# Patient Record
Sex: Female | Born: 1955 | Race: White | Hispanic: No | Marital: Married | Smoking: Never smoker
Health system: Southern US, Community
[De-identification: ages and names within clinical notes are randomized; demographics above are authoritative.]

## PROBLEM LIST (undated history)

## (undated) DIAGNOSIS — IMO0002 Reserved for concepts with insufficient information to code with codable children: Secondary | ICD-10-CM

## (undated) DIAGNOSIS — E079 Disorder of thyroid, unspecified: Secondary | ICD-10-CM

## (undated) DIAGNOSIS — I10 Essential (primary) hypertension: Secondary | ICD-10-CM

## (undated) HISTORY — DX: Disorder of thyroid, unspecified: E07.9

## (undated) HISTORY — DX: Essential (primary) hypertension: I10

## (undated) HISTORY — PX: COLONOSCOPY: SHX174

## (undated) HISTORY — PX: APPENDECTOMY: SHX54

## (undated) HISTORY — DX: Reserved for concepts with insufficient information to code with codable children: IMO0002

---

## 2002-03-28 ENCOUNTER — Emergency Department (HOSPITAL_COMMUNITY): Admission: EM | Admit: 2002-03-28 | Discharge: 2002-03-29 | Payer: Self-pay | Admitting: Emergency Medicine

## 2002-03-28 ENCOUNTER — Encounter: Payer: Self-pay | Admitting: Emergency Medicine

## 2002-04-09 ENCOUNTER — Ambulatory Visit (HOSPITAL_COMMUNITY): Admission: RE | Admit: 2002-04-09 | Discharge: 2002-04-09 | Payer: Self-pay | Admitting: Family Medicine

## 2002-04-09 ENCOUNTER — Encounter: Payer: Self-pay | Admitting: Family Medicine

## 2003-07-11 ENCOUNTER — Encounter: Admission: RE | Admit: 2003-07-11 | Discharge: 2003-08-28 | Payer: Self-pay | Admitting: Orthopedic Surgery

## 2007-09-11 ENCOUNTER — Encounter: Admission: RE | Admit: 2007-09-11 | Discharge: 2007-09-11 | Payer: Self-pay | Admitting: Orthopedic Surgery

## 2012-08-06 ENCOUNTER — Other Ambulatory Visit: Payer: Self-pay | Admitting: Nurse Practitioner

## 2012-08-09 NOTE — Telephone Encounter (Signed)
LAST LABS 12/13 SUPPOSE TO RETURN IN 6 WEEKS. NO LABS IN CHART.

## 2012-09-10 ENCOUNTER — Ambulatory Visit (INDEPENDENT_AMBULATORY_CARE_PROVIDER_SITE_OTHER): Payer: BC Managed Care – PPO | Admitting: Nurse Practitioner

## 2012-09-10 ENCOUNTER — Encounter: Payer: Self-pay | Admitting: Nurse Practitioner

## 2012-09-10 VITALS — BP 110/64 | HR 66 | Temp 97.9°F | Ht 61.4 in | Wt 137.0 lb

## 2012-09-10 DIAGNOSIS — I10 Essential (primary) hypertension: Secondary | ICD-10-CM

## 2012-09-10 DIAGNOSIS — E039 Hypothyroidism, unspecified: Secondary | ICD-10-CM

## 2012-09-10 MED ORDER — LEVOTHYROXINE SODIUM 88 MCG PO TABS: 88.0000 ug | ORAL_TABLET | Freq: Every day | ORAL | Status: DC

## 2012-09-10 MED ORDER — LISINOPRIL-HYDROCHLOROTHIAZIDE 10-12.5 MG PO TABS: 1.0000 | ORAL_TABLET | Freq: Every day | ORAL | Status: DC

## 2012-09-10 NOTE — Patient Instructions (Signed)

## 2012-09-10 NOTE — Progress Notes (Signed)
  Subjective:    Patient ID: Alison Simmons, female    DOB: 04/04/1956, 57 y.o.   MRN: [id number]  Hypertension This is a chronic problem. The current episode started more than 1 year ago. The problem is unchanged. The problem is controlled. Pertinent negatives include no anxiety, blurred vision, chest pain, headaches, neck pain, palpitations, peripheral edema or shortness of breath. There are no associated agents to hypertension. Risk factors for coronary artery disease include dyslipidemia and post-menopausal state. Past treatments include ACE inhibitors and diuretics. The current treatment provides significant improvement. Hypertensive end-organ damage includes a thyroid problem.  Thyroid Problem Presents for follow-up visit. Symptoms include fatigue. Patient reports no anxiety, diarrhea, dry skin, hair loss, hoarse voice or palpitations. The symptoms have been stable.       Review of Systems  Constitutional: Positive for fatigue.  HENT: Negative for hoarse voice and neck pain.   Eyes: Negative for blurred vision.  Respiratory: Negative for shortness of breath.   Cardiovascular: Negative for chest pain and palpitations.  Gastrointestinal: Negative for diarrhea.  Neurological: Negative for headaches.  All other systems reviewed and are negative.       Objective:   Physical Exam  Constitutional: She is oriented to person, place, and time. She appears well-developed and well-nourished.  HENT:  Nose: Nose normal.  Mouth/Throat: Oropharynx is clear and moist.  Eyes: EOM are normal.  Neck: Trachea normal, normal range of motion and full passive range of motion without pain. Neck supple. No JVD present. Carotid bruit is not present. No thyromegaly present.  Cardiovascular: Normal rate, regular rhythm, normal heart sounds and intact distal pulses.  Exam reveals no gallop and no friction rub.   No murmur heard. Pulmonary/Chest: Effort normal and breath sounds normal.  Abdominal: Soft.  Bowel sounds are normal. She exhibits no distension and no mass. There is no tenderness.  Musculoskeletal: Normal range of motion.  Lymphadenopathy:    She has no cervical adenopathy.  Neurological: She is alert and oriented to person, place, and time. She has normal reflexes.  Skin: Skin is warm and dry.  Psychiatric: She has a normal mood and affect. Her behavior is normal. Judgment and thought content normal.  BP 110/64  Pulse 66  Temp(Src) 97.9 F (36.6 C) (Oral)  Ht 5' 1.4" (1.56 m)  Wt 137 lb (62.143 kg)  BMI 25.54 kg/m2         Assessment & Plan:  1. Hypertension LOW NA+ diet  - COMPLETE METABOLIC PANEL WITH GFR - lisinopril-hydrochlorothiazide (PRINZIDE,ZESTORETIC) 10-12.5 MG per tablet; Take 1 tablet by mouth daily.  Dispense: 90 tablet; Refill: 1  2. Hypothyroidism Rest - Thyroid Panel With TSH - levothyroxine (SYNTHROID, LEVOTHROID) 88 MCG tablet; Take 1 tablet (88 mcg total) by mouth daily before breakfast.  Dispense: 90 tablet; Refill: 1   Mary-Margaret Daphine Deutscher, FNP

## 2012-09-11 LAB — COMPLETE METABOLIC PANEL WITH GFR
ALT: 24 U/L (ref 0–35)
AST: 19 U/L (ref 0–37)
Albumin: 4.2 g/dL (ref 3.5–5.2)
Alkaline Phosphatase: 87 U/L (ref 39–117)
BUN: 20 mg/dL (ref 6–23)
CO2: 29 mEq/L (ref 19–32)
Calcium: 9.9 mg/dL (ref 8.4–10.5)
Chloride: 101 mEq/L (ref 96–112)
Creat: 0.97 mg/dL (ref 0.50–1.10)
GFR, Est African American: 77 mL/min
GFR, Est Non African American: 66 mL/min
Glucose, Bld: 93 mg/dL (ref 70–99)
Potassium: 4.8 mEq/L (ref 3.5–5.3)
Sodium: 137 mEq/L (ref 135–145)
Total Bilirubin: 0.6 mg/dL (ref 0.3–1.2)
Total Protein: 6.7 g/dL (ref 6.0–8.3)

## 2012-09-11 LAB — THYROID PANEL WITH TSH
Free Thyroxine Index: 3.5 (ref 1.0–3.9)
T3 Uptake: 35.4 % (ref 22.5–37.0)
T4, Total: 10 ug/dL (ref 5.0–12.5)
TSH: 0.658 u[IU]/mL (ref 0.350–4.500)

## 2013-02-17 ENCOUNTER — Other Ambulatory Visit: Payer: Self-pay

## 2013-03-02 ENCOUNTER — Encounter: Payer: Self-pay | Admitting: Nurse Practitioner

## 2013-03-02 ENCOUNTER — Ambulatory Visit (INDEPENDENT_AMBULATORY_CARE_PROVIDER_SITE_OTHER): Payer: BC Managed Care – PPO | Admitting: Nurse Practitioner

## 2013-03-02 VITALS — BP 137/79 | HR 62 | Temp 97.6°F | Ht 61.0 in | Wt 145.0 lb

## 2013-03-02 DIAGNOSIS — E039 Hypothyroidism, unspecified: Secondary | ICD-10-CM

## 2013-03-02 DIAGNOSIS — I10 Essential (primary) hypertension: Secondary | ICD-10-CM

## 2013-03-02 MED ORDER — HYDROCODONE-ACETAMINOPHEN 5-325 MG PO TABS: 1.0000 | ORAL_TABLET | Freq: Four times a day (QID) | ORAL | Status: DC | PRN

## 2013-03-02 MED ORDER — LISINOPRIL-HYDROCHLOROTHIAZIDE 10-12.5 MG PO TABS: 1.0000 | ORAL_TABLET | Freq: Every day | ORAL | Status: DC

## 2013-03-02 NOTE — Progress Notes (Signed)
  Subjective:    Patient ID: Alison Simmons, female    DOB: 1956-03-20, 57 y.o.   MRN: [id number]  Hypertension This is a chronic problem. The current episode started more than 1 year ago. The problem is unchanged. The problem is controlled. Pertinent negatives include no anxiety, blurred vision, chest pain, headaches, neck pain, palpitations, peripheral edema or shortness of breath. There are no associated agents to hypertension. Risk factors for coronary artery disease include dyslipidemia and post-menopausal state. Past treatments include ACE inhibitors and diuretics. The current treatment provides significant improvement. Hypertensive end-organ damage includes a thyroid problem.  Thyroid Problem Presents for follow-up visit. Symptoms include fatigue. Patient reports no anxiety, diarrhea, dry skin, hair loss, hoarse voice or palpitations. The symptoms have been stable.       Review of Systems  Constitutional: Positive for fatigue.  HENT: Negative for hoarse voice.   Eyes: Negative for blurred vision.  Respiratory: Negative for shortness of breath.   Cardiovascular: Negative for chest pain and palpitations.  Gastrointestinal: Negative for diarrhea.  Musculoskeletal: Negative for neck pain.  Neurological: Negative for headaches.  All other systems reviewed and are negative.       Objective:   Physical Exam  Constitutional: She is oriented to person, place, and time. She appears well-developed and well-nourished.  HENT:  Nose: Nose normal.  Mouth/Throat: Oropharynx is clear and moist.  Eyes: EOM are normal.  Neck: Trachea normal, normal range of motion and full passive range of motion without pain. Neck supple. No JVD present. Carotid bruit is not present. No thyromegaly present.  Cardiovascular: Normal rate, regular rhythm, normal heart sounds and intact distal pulses.  Exam reveals no gallop and no friction rub.   No murmur heard. Pulmonary/Chest: Effort normal and breath sounds  normal.  Abdominal: Soft. Bowel sounds are normal. She exhibits no distension and no mass. There is no tenderness.  Musculoskeletal: Normal range of motion.  Lymphadenopathy:    She has no cervical adenopathy.  Neurological: She is alert and oriented to person, place, and time. She has normal reflexes.  Skin: Skin is warm and dry.  Psychiatric: She has a normal mood and affect. Her behavior is normal. Judgment and thought content normal.    BP 137/79  Pulse 62  Temp(Src) 97.6 F (36.4 C) (Oral)  Ht 5\' 1"  (1.549 m)  Wt 145 lb (65.772 kg)  BMI 27.41 kg/m2        Assessment & Plan:  1. Hypertension LOW NA+ diet  - COMPLETE METABOLIC PANEL WITH GFR - lisinopril-hydrochlorothiazide (PRINZIDE,ZESTORETIC) 10-12.5 MG per tablet; Take 1 tablet by mouth daily.  Dispense: 90 tablet; Refill: 1  2. Hypothyroidism Rest - Thyroid Panel With TSH - levothyroxine (SYNTHROID, LEVOTHROID) 88 MCG tablet; Take 1 tablet (88 mcg total) by mouth daily before breakfast.  Dispense: 90 tablet; Refill: 1   Mary-Margaret Daphine Deutscher, FNP

## 2013-03-02 NOTE — Patient Instructions (Signed)
Stress Management Stress is a state of physical or mental tension that often results from changes in your life or normal routine. Some common causes of stress are:  Death of a loved one.  Injuries or severe illnesses.  Getting fired or changing jobs.  Moving into a new home. Other causes may be:  Sexual problems.  Business or financial losses.  Taking on a large debt.  Regular conflict with someone at home or at work.  Constant tiredness from lack of sleep. It is not just bad things that are stressful. It may be stressful to:  Win the lottery.  Get married.  Buy a new car. The amount of stress that can be easily tolerated varies from person to person. Changes generally cause stress, regardless of the types of change. Too much stress can affect your health. It may lead to physical or emotional problems. Too little stress (boredom) may also become stressful. SUGGESTIONS TO REDUCE STRESS:  Talk things over with your family and friends. It often is helpful to share your concerns and worries. If you feel your problem is serious, you may want to get help from a professional counselor.  Consider your problems one at a time instead of lumping them all together. Trying to take care of everything at once may seem impossible. List all the things you need to do and then start with the most important one. Set a goal to accomplish 2 or 3 things each day. If you expect to do too many in a single day you will naturally fail, causing you to feel even more stressed.  Do not use alcohol or drugs to relieve stress. Although you may feel better for a short time, they do not remove the problems that caused the stress. They can also be habit forming.  Exercise regularly - at least 3 times per week. Physical exercise can help to relieve that "uptight" feeling and will relax you.  The shortest distance between despair and hope is often a good night's sleep.  Go to bed and get up on time allowing  yourself time for appointments without being rushed.  Take a short "time-out" period from any stressful situation that occurs during the day. Close your eyes and take some deep breaths. Starting with the muscles in your face, tense them, hold it for a few seconds, then relax. Repeat this with the muscles in your neck, shoulders, hand, stomach, back and legs.  Take good care of yourself. Eat a balanced diet and get plenty of rest.  Schedule time for having fun. Take a break from your daily routine to relax. HOME CARE INSTRUCTIONS   Call if you feel overwhelmed by your problems and feel you can no longer manage them on your own.  Return immediately if you feel like hurting yourself or someone else. Document Released: 09/24/2000 Document Revised: 06/23/2011 Document Reviewed: 11/23/2012 [employer] Patient Information 2014 [employer].  

## 2013-03-04 ENCOUNTER — Other Ambulatory Visit: Payer: Self-pay | Admitting: Nurse Practitioner

## 2013-03-04 LAB — CMP14+EGFR
ALT: 14 IU/L (ref 0–32)
AST: 16 IU/L (ref 0–40)
Albumin/Globulin Ratio: 2 (ref 1.1–2.5)
Albumin: 4.8 g/dL (ref 3.5–5.5)
BUN/Creatinine Ratio: 17 (ref 9–23)
BUN: 12 mg/dL (ref 6–24)
CO2: 25 mmol/L (ref 18–29)
Calcium: 10 mg/dL (ref 8.7–10.2)
Creatinine, Ser: 0.71 mg/dL (ref 0.57–1.00)
GFR calc non Af Amer: 97 mL/min/{1.73_m2} (ref >59)
Globulin, Total: 2.4 g/dL (ref 1.5–4.5)
Sodium: 142 mmol/L (ref 134–144)
Total Bilirubin: 1.1 mg/dL (ref 0.0–1.2)

## 2013-03-04 LAB — NMR, LIPOPROFILE
Cholesterol: 209 mg/dL — ABNORMAL HIGH (ref <200)
LDL Particle Number: 1919 nmol/L — ABNORMAL HIGH (ref <1000)
LDL Size: 20.5 nm — ABNORMAL LOW (ref >20.5)
LDLC SERPL CALC-MCNC: 116 mg/dL — ABNORMAL HIGH (ref <100)
LP-IR Score: <25 (ref <=45)
Triglycerides by NMR: 156 mg/dL — ABNORMAL HIGH (ref <150)

## 2013-03-04 LAB — THYROID PANEL WITH TSH
T3 Uptake Ratio: 28 % (ref 24–39)
T4, Total: 8.3 ug/dL (ref 4.5–12.0)

## 2013-03-04 MED ORDER — LEVOTHYROXINE SODIUM 100 MCG PO TABS: 100.0000 ug | ORAL_TABLET | Freq: Every day | ORAL | Status: DC

## 2013-08-03 ENCOUNTER — Ambulatory Visit (INDEPENDENT_AMBULATORY_CARE_PROVIDER_SITE_OTHER): Payer: BC Managed Care – PPO | Admitting: Nurse Practitioner

## 2013-08-03 ENCOUNTER — Encounter: Payer: Self-pay | Admitting: Nurse Practitioner

## 2013-08-03 VITALS — BP 112/68 | Temp 97.5°F | Ht 61.0 in | Wt 138.0 lb

## 2013-08-03 DIAGNOSIS — E039 Hypothyroidism, unspecified: Secondary | ICD-10-CM

## 2013-08-03 DIAGNOSIS — Z13820 Encounter for screening for osteoporosis: Secondary | ICD-10-CM

## 2013-08-03 DIAGNOSIS — L989 Disorder of the skin and subcutaneous tissue, unspecified: Secondary | ICD-10-CM

## 2013-08-03 DIAGNOSIS — R5383 Other fatigue: Secondary | ICD-10-CM

## 2013-08-03 DIAGNOSIS — I10 Essential (primary) hypertension: Secondary | ICD-10-CM

## 2013-08-03 DIAGNOSIS — R5381 Other malaise: Secondary | ICD-10-CM

## 2013-08-03 DIAGNOSIS — R238 Other skin changes: Secondary | ICD-10-CM

## 2013-08-03 MED ORDER — LISINOPRIL-HYDROCHLOROTHIAZIDE 10-12.5 MG PO TABS: 1.0000 | ORAL_TABLET | Freq: Every day | ORAL | Status: DC

## 2013-08-03 NOTE — Progress Notes (Signed)
Subjective:    Patient ID: Alison Simmons, female    DOB: 08/11/1955, 58 y.o.   MRN: [id number]  Patient here today for follow up of chronic medical problems. Stays exhausted all the time. Says that she sleeps ok at night.  Hypertension This is a chronic problem. The current episode started more than 1 year ago. The problem is unchanged. The problem is controlled. Pertinent negatives include no anxiety, blurred vision, chest pain, headaches, neck pain, palpitations, peripheral edema or shortness of breath. There are no associated agents to hypertension. Risk factors for coronary artery disease include dyslipidemia and post-menopausal state. Past treatments include ACE inhibitors and diuretics. The current treatment provides significant improvement. Hypertensive end-organ damage includes a thyroid problem.  Thyroid Problem Presents for follow-up visit. Symptoms include fatigue. Patient reports no anxiety, diarrhea, dry skin, hair loss, hoarse voice or palpitations. The symptoms have been stable (Her last TSH was >9 and we increased her levothyroxin to 145mg- She says she feels terrible.).    * scalp is very dry with sores that come up around scalp line- wants to see dermatology   Review of Systems  Constitutional: Positive for fatigue.  HENT: Negative for hoarse voice.   Eyes: Negative for blurred vision.  Respiratory: Negative for shortness of breath.   Cardiovascular: Negative for chest pain and palpitations.  Gastrointestinal: Negative for diarrhea.  Musculoskeletal: Negative for neck pain.  Neurological: Negative for headaches.  All other systems reviewed and are negative.      Objective:   Physical Exam  Constitutional: She is oriented to person, place, and time. She appears well-developed and well-nourished.  HENT:  Nose: Nose normal.  Mouth/Throat: Oropharynx is clear and moist.  Eyes: EOM are normal.  Neck: Trachea normal, normal range of motion and full passive range of  motion without pain. Neck supple. No JVD present. Carotid bruit is not present. No thyromegaly present.  Cardiovascular: Normal rate, regular rhythm, normal heart sounds and intact distal pulses.  Exam reveals no gallop and no friction rub.   No murmur heard. Pulmonary/Chest: Effort normal and breath sounds normal.  Abdominal: Soft. Bowel sounds are normal. She exhibits no distension and no mass. There is no tenderness.  Musculoskeletal: Normal range of motion.  Lymphadenopathy:    She has no cervical adenopathy.  Neurological: She is alert and oriented to person, place, and time. She has normal reflexes.  Skin: Skin is warm and dry.  No current scalp lesions  Psychiatric: She has a normal mood and affect. Her behavior is normal. Judgment and thought content normal.   BP 112/68  Temp(Src) 97.5 F (36.4 C) (Oral)  Ht _0  (1.549 m)  Wt 138 lb (62.596 kg)  BMI 26.09 kg/m2         Assessment & Plan:   1. Hypothyroidism   2. Hypertension   3. Fatigue   4. Scalp irritation    Orders Placed This Encounter  Procedures  . CMP14+EGFR  . Anemia Profile B  . Thyroid Panel With TSH  . Ambulatory referral to Dermatology    Referral Priority:  Routine    Referral Type:  Consultation    Referral Reason:  Specialty Services Required    Requested Specialty:  Dermatology    Number of Visits Requested:  1   Meds ordered this encounter  Medications  . aspirin 81 MG tablet    Sig: Take 81 mg by mouth daily.  .Marland Kitchenlisinopril-hydrochlorothiazide (PRINZIDE,ZESTORETIC) 10-12.5 MG per tablet    Sig: Take 1  tablet by mouth daily.    Dispense:  90 tablet    Refill:  1    Order Specific Question:  Supervising Provider    Answer:  Chipper Herb [[id number]]    Labs pending Health maintenance reviewed Diet and exercise encouraged Continue all meds Follow up  In 3 months   [place], FNP

## 2013-08-03 NOTE — Patient Instructions (Signed)

## 2013-08-04 ENCOUNTER — Other Ambulatory Visit: Payer: Self-pay | Admitting: Nurse Practitioner

## 2013-08-04 LAB — CMP14+EGFR
A/G RATIO: 2 (ref 1.1–2.5)
ALK PHOS: 93 IU/L (ref 39–117)
ALT: 27 IU/L (ref 0–32)
AST: 23 IU/L (ref 0–40)
Albumin: 4.8 g/dL (ref 3.5–5.5)
BILIRUBIN TOTAL: 1 mg/dL (ref 0.0–1.2)
BUN / CREAT RATIO: 19 (ref 9–23)
BUN: 13 mg/dL (ref 6–24)
CO2: 26 mmol/L (ref 18–29)
Calcium: 10.3 mg/dL — ABNORMAL HIGH (ref 8.7–10.2)
Chloride: 96 mmol/L — ABNORMAL LOW (ref 97–108)
Creatinine, Ser: 0.67 mg/dL (ref 0.57–1.00)
GFR, EST AFRICAN AMERICAN: 114 mL/min/{1.73_m2} (ref >59)
GFR, EST NON AFRICAN AMERICAN: 99 mL/min/{1.73_m2} (ref >59)
Globulin, Total: 2.4 g/dL (ref 1.5–4.5)
Glucose: 89 mg/dL (ref 65–99)
POTASSIUM: 5.2 mmol/L (ref 3.5–5.2)
SODIUM: 137 mmol/L (ref 134–144)
Total Protein: 7.2 g/dL (ref 6.0–8.5)

## 2013-08-04 LAB — ANEMIA PROFILE B
BASOS ABS: 0.1 10*3/uL (ref 0.0–0.2)
Basos: 1 %
Eos: 4 %
Eosinophils Absolute: 0.3 10*3/uL (ref 0.0–0.4)
FOLATE: 14 ng/mL (ref >3.0)
Ferritin: 110 ng/mL (ref 15–150)
HCT: 42.5 % (ref 34.0–46.6)
HEMOGLOBIN: 14.3 g/dL (ref 11.1–15.9)
IMMATURE GRANS (ABS): 0 10*3/uL (ref 0.0–0.1)
Immature Granulocytes: 0 %
Iron Saturation: 31 % (ref 15–55)
Iron: 107 ug/dL (ref 35–155)
LYMPHS: 33 %
Lymphocytes Absolute: 2.6 10*3/uL (ref 0.7–3.1)
MCH: 28.1 pg (ref 26.6–33.0)
MCHC: 33.6 g/dL (ref 31.5–35.7)
MCV: 84 fL (ref 79–97)
MONOCYTES: 7 %
Monocytes Absolute: 0.6 10*3/uL (ref 0.1–0.9)
NEUTROS ABS: 4.5 10*3/uL (ref 1.4–7.0)
NEUTROS PCT: 55 %
Platelets: 277 10*3/uL (ref 150–379)
RBC: 5.08 x10E6/uL (ref 3.77–5.28)
RDW: 13.5 % (ref 12.3–15.4)
RETIC CT PCT: 0.9 % (ref 0.6–2.6)
TIBC: 349 ug/dL (ref 250–450)
UIBC: 242 ug/dL (ref 150–375)
VITAMIN B 12: 399 pg/mL (ref 211–946)
WBC: 8 10*3/uL (ref 3.4–10.8)

## 2013-08-04 LAB — THYROID PANEL WITH TSH
FREE THYROXINE INDEX: 4.2 (ref 1.2–4.9)
T3 Uptake Ratio: 33 % (ref 24–39)
T4, Total: 12.8 ug/dL — ABNORMAL HIGH (ref 4.5–12.0)
TSH: 0.146 u[IU]/mL — AB (ref 0.450–4.500)

## 2013-08-04 MED ORDER — LEVOTHYROXINE SODIUM 88 MCG PO TABS: 88.0000 ug | ORAL_TABLET | Freq: Every day | ORAL | Status: DC

## 2013-08-31 ENCOUNTER — Other Ambulatory Visit: Payer: BC Managed Care – PPO

## 2013-09-21 ENCOUNTER — Ambulatory Visit: Payer: BC Managed Care – PPO

## 2014-01-03 ENCOUNTER — Ambulatory Visit: Payer: BC Managed Care – PPO | Attending: Orthopedic Surgery | Admitting: Physical Therapy

## 2014-01-03 DIAGNOSIS — M25579 Pain in unspecified ankle and joints of unspecified foot: Secondary | ICD-10-CM | POA: Diagnosis not present

## 2014-01-03 DIAGNOSIS — M25673 Stiffness of unspecified ankle, not elsewhere classified: Secondary | ICD-10-CM | POA: Insufficient documentation

## 2014-01-03 DIAGNOSIS — R269 Unspecified abnormalities of gait and mobility: Secondary | ICD-10-CM | POA: Insufficient documentation

## 2014-01-03 DIAGNOSIS — R5381 Other malaise: Secondary | ICD-10-CM | POA: Insufficient documentation

## 2014-01-03 DIAGNOSIS — IMO0001 Reserved for inherently not codable concepts without codable children: Secondary | ICD-10-CM | POA: Insufficient documentation

## 2014-01-03 DIAGNOSIS — M25676 Stiffness of unspecified foot, not elsewhere classified: Secondary | ICD-10-CM | POA: Insufficient documentation

## 2014-01-04 ENCOUNTER — Ambulatory Visit: Payer: BC Managed Care – PPO | Admitting: Physical Therapy

## 2014-01-04 DIAGNOSIS — IMO0001 Reserved for inherently not codable concepts without codable children: Secondary | ICD-10-CM | POA: Diagnosis not present

## 2014-01-09 ENCOUNTER — Ambulatory Visit: Payer: BC Managed Care – PPO | Admitting: Physical Therapy

## 2014-01-09 DIAGNOSIS — IMO0001 Reserved for inherently not codable concepts without codable children: Secondary | ICD-10-CM | POA: Diagnosis not present

## 2014-01-11 ENCOUNTER — Encounter: Payer: BC Managed Care – PPO | Admitting: Physical Therapy

## 2014-01-12 ENCOUNTER — Ambulatory Visit: Payer: BC Managed Care – PPO | Attending: Orthopedic Surgery | Admitting: *Deleted

## 2014-01-12 DIAGNOSIS — R5381 Other malaise: Secondary | ICD-10-CM | POA: Diagnosis not present

## 2014-01-12 DIAGNOSIS — M25571 Pain in right ankle and joints of right foot: Secondary | ICD-10-CM | POA: Diagnosis not present

## 2014-01-12 DIAGNOSIS — R269 Unspecified abnormalities of gait and mobility: Secondary | ICD-10-CM | POA: Diagnosis not present

## 2014-01-12 DIAGNOSIS — M25671 Stiffness of right ankle, not elsewhere classified: Secondary | ICD-10-CM | POA: Insufficient documentation

## 2014-01-12 DIAGNOSIS — Z5189 Encounter for other specified aftercare: Secondary | ICD-10-CM | POA: Insufficient documentation

## 2014-01-18 ENCOUNTER — Ambulatory Visit: Payer: BC Managed Care – PPO | Admitting: Physical Therapy

## 2014-01-18 DIAGNOSIS — Z5189 Encounter for other specified aftercare: Secondary | ICD-10-CM | POA: Diagnosis not present

## 2014-01-29 ENCOUNTER — Other Ambulatory Visit: Payer: Self-pay | Admitting: Nurse Practitioner

## 2014-02-22 ENCOUNTER — Other Ambulatory Visit: Payer: Self-pay | Admitting: Nurse Practitioner

## 2014-02-22 MED ORDER — LISINOPRIL-HYDROCHLOROTHIAZIDE 10-12.5 MG PO TABS: 1.0000 | ORAL_TABLET | Freq: Every day | ORAL | Status: DC

## 2014-02-22 MED ORDER — LEVOTHYROXINE SODIUM 88 MCG PO TABS: ORAL_TABLET | ORAL | Status: DC

## 2014-02-22 NOTE — Telephone Encounter (Signed)
done

## 2014-04-19 ENCOUNTER — Encounter: Payer: Self-pay | Admitting: Nurse Practitioner

## 2014-04-19 ENCOUNTER — Ambulatory Visit (INDEPENDENT_AMBULATORY_CARE_PROVIDER_SITE_OTHER): Payer: BC Managed Care – PPO | Admitting: Nurse Practitioner

## 2014-04-19 VITALS — BP 119/80 | HR 60 | Temp 96.8°F | Ht 61.0 in | Wt 140.0 lb

## 2014-04-19 DIAGNOSIS — E039 Hypothyroidism, unspecified: Secondary | ICD-10-CM

## 2014-04-19 DIAGNOSIS — I10 Essential (primary) hypertension: Secondary | ICD-10-CM

## 2014-04-19 MED ORDER — LISINOPRIL-HYDROCHLOROTHIAZIDE 10-12.5 MG PO TABS
1.0000 | ORAL_TABLET | Freq: Every day | ORAL | Status: DC
Start: 2014-04-19 — End: 2014-05-23

## 2014-04-19 MED ORDER — HYDROCODONE-ACETAMINOPHEN 5-325 MG PO TABS: 1.0000 | ORAL_TABLET | Freq: Four times a day (QID) | ORAL | Status: DC | PRN

## 2014-04-19 MED ORDER — LEVOTHYROXINE SODIUM 88 MCG PO TABS: ORAL_TABLET | ORAL | Status: DC

## 2014-04-19 NOTE — Addendum Note (Signed)
Addended by: Chevis Pretty on: 04/19/2014 03:13 PM   Modules accepted: Orders

## 2014-04-19 NOTE — Patient Instructions (Signed)

## 2014-04-19 NOTE — Progress Notes (Signed)
  Subjective:    Patient ID: Alison Simmons, female    DOB: 1955-08-19, 59 y.o.   MRN: [id number]  Hypertension This is a chronic problem. The current episode started more than 1 year ago. The problem is unchanged. The problem is controlled. Pertinent negatives include no chest pain, headaches, neck pain, palpitations or shortness of breath. Risk factors for coronary artery disease include dyslipidemia, family history and post-menopausal state. Past treatments include ACE inhibitors and diuretics. The current treatment provides moderate improvement. Compliance problems include diet and exercise.  Hypertensive end-organ damage includes a thyroid problem.  Thyroid Problem Presents for follow-up (hypothyroidism) visit. Patient reports no diarrhea or palpitations. The symptoms have been stable.       Review of Systems  Constitutional: Negative.   HENT: Negative.   Respiratory: Negative for shortness of breath.   Cardiovascular: Negative for chest pain and palpitations.  Gastrointestinal: Negative for diarrhea.  Genitourinary: Negative.   Musculoskeletal: Negative for neck pain.  Neurological: Negative.  Negative for headaches.  Psychiatric/Behavioral: Negative.   All other systems reviewed and are negative.      Objective:   Physical Exam  Constitutional: She is oriented to person, place, and time. She appears well-developed and well-nourished.  HENT:  Nose: Nose normal.  Mouth/Throat: Oropharynx is clear and moist.  Eyes: EOM are normal.  Neck: Trachea normal, normal range of motion and full passive range of motion without pain. Neck supple. No JVD present. Carotid bruit is not present. No thyromegaly present.  Cardiovascular: Normal rate, regular rhythm, normal heart sounds and intact distal pulses.  Exam reveals no gallop and no friction rub.   No murmur heard. Pulmonary/Chest: Effort normal and breath sounds normal.  Abdominal: Soft. Bowel sounds are normal. She exhibits no  distension and no mass. There is no tenderness.  Musculoskeletal: Normal range of motion.  Lymphadenopathy:    She has no cervical adenopathy.  Neurological: She is alert and oriented to person, place, and time. She has normal reflexes.  Skin: Skin is warm and dry.  Psychiatric: She has a normal mood and affect. Her behavior is normal. Judgment and thought content normal.    BP 119/80 mmHg  Pulse 60  Temp(Src) 96.8 F (36 C) (Oral)  Ht $R'5\' 1"'St$  (1.549 m)  Wt 140 lb (63.504 kg)  BMI 26.47 kg/m2        Assessment & Plan:  1. Essential hypertension Do not add salt to diet - CMP14+EGFR - NMR, lipoprofile - lisinopril-hydrochlorothiazide (PRINZIDE,ZESTORETIC) 10-12.5 MG per tablet; Take 1 tablet by mouth daily.  Dispense: 90 tablet; Refill: 1  2. Hypothyroidism, unspecified hypothyroidism type - Thyroid Panel With TSH - levothyroxine (SYNTHROID, LEVOTHROID) 88 MCG tablet; TAKE ONE TABLET BY MOUTH ONE TIME DAILY  Dispense: 90 tablet; Refill: 1   Refuses health maintenance Labs pending Health maintenance reviewed Diet and exercise encouraged Continue all meds Follow up  In 6 months   [place], FNP

## 2014-04-20 LAB — NMR, LIPOPROFILE
CHOLESTEROL: 224 mg/dL — AB (ref 100–199)
HDL Cholesterol by NMR: 63 mg/dL (ref >39)
HDL PARTICLE NUMBER: 37.9 umol/L (ref >=30.5)
LDL PARTICLE NUMBER: 1620 nmol/L — AB (ref <1000)
LDL SIZE: 20.5 nm (ref >20.5)
LDL-C: 139 mg/dL — ABNORMAL HIGH (ref 0–99)
LP-IR SCORE: 39 (ref <=45)
Small LDL Particle Number: 798 nmol/L — ABNORMAL HIGH (ref <=527)
TRIGLYCERIDES BY NMR: 108 mg/dL (ref 0–149)

## 2014-04-20 LAB — CMP14+EGFR
ALT: 37 IU/L — AB (ref 0–32)
AST: 22 IU/L (ref 0–40)
Albumin/Globulin Ratio: 1.9 (ref 1.1–2.5)
Albumin: 4.6 g/dL (ref 3.5–5.5)
Alkaline Phosphatase: 103 IU/L (ref 39–117)
BUN / CREAT RATIO: 13 (ref 9–23)
BUN: 9 mg/dL (ref 6–24)
CALCIUM: 10.3 mg/dL — AB (ref 8.7–10.2)
CHLORIDE: 96 mmol/L — AB (ref 97–108)
CO2: 25 mmol/L (ref 18–29)
Creatinine, Ser: 0.68 mg/dL (ref 0.57–1.00)
GFR, EST AFRICAN AMERICAN: 113 mL/min/{1.73_m2} (ref >59)
GFR, EST NON AFRICAN AMERICAN: 98 mL/min/{1.73_m2} (ref >59)
Globulin, Total: 2.4 g/dL (ref 1.5–4.5)
Glucose: 86 mg/dL (ref 65–99)
POTASSIUM: 4.6 mmol/L (ref 3.5–5.2)
SODIUM: 137 mmol/L (ref 134–144)
Total Bilirubin: 1.4 mg/dL — ABNORMAL HIGH (ref 0.0–1.2)
Total Protein: 7 g/dL (ref 6.0–8.5)

## 2014-04-20 LAB — THYROID PANEL WITH TSH
Free Thyroxine Index: 3 (ref 1.2–4.9)
T3 UPTAKE RATIO: 29 % (ref 24–39)
T4 TOTAL: 10.4 ug/dL (ref 4.5–12.0)
TSH: 0.336 u[IU]/mL — ABNORMAL LOW (ref 0.450–4.500)

## 2014-05-23 ENCOUNTER — Other Ambulatory Visit: Payer: Self-pay | Admitting: Nurse Practitioner

## 2014-10-26 ENCOUNTER — Ambulatory Visit: Payer: BC Managed Care – PPO | Admitting: Nurse Practitioner

## 2014-11-08 ENCOUNTER — Ambulatory Visit: Payer: BC Managed Care – PPO | Admitting: Nurse Practitioner

## 2014-11-28 ENCOUNTER — Ambulatory Visit (INDEPENDENT_AMBULATORY_CARE_PROVIDER_SITE_OTHER): Payer: BC Managed Care – PPO

## 2014-11-28 ENCOUNTER — Ambulatory Visit (INDEPENDENT_AMBULATORY_CARE_PROVIDER_SITE_OTHER): Payer: BC Managed Care – PPO | Admitting: Nurse Practitioner

## 2014-11-28 ENCOUNTER — Encounter: Payer: Self-pay | Admitting: Nurse Practitioner

## 2014-11-28 VITALS — BP 118/76 | HR 61 | Temp 96.5°F | Ht 61.0 in | Wt 136.0 lb

## 2014-11-28 DIAGNOSIS — E039 Hypothyroidism, unspecified: Secondary | ICD-10-CM

## 2014-11-28 DIAGNOSIS — I10 Essential (primary) hypertension: Secondary | ICD-10-CM | POA: Diagnosis not present

## 2014-11-28 DIAGNOSIS — M25552 Pain in left hip: Secondary | ICD-10-CM

## 2014-11-28 MED ORDER — LISINOPRIL-HYDROCHLOROTHIAZIDE 10-12.5 MG PO TABS: 1.0000 | ORAL_TABLET | Freq: Every day | ORAL | Status: DC

## 2014-11-28 MED ORDER — LEVOTHYROXINE SODIUM 88 MCG PO TABS: 88.0000 ug | ORAL_TABLET | Freq: Every day | ORAL | Status: DC

## 2014-11-28 NOTE — Progress Notes (Signed)
  Subjective:    Patient ID: Alison Simmons, female    DOB: [birth year], 59 y.o.   MRN: [id number]   Patient here today for follow up of chronic medical problems. * C/o left hip pain that worsens when she is walking or sitting for long periods of time. Rates pain 4-8/10. Has a tneder spot in mid lower back.  Hypertension This is a chronic problem. The current episode started more than 1 year ago. The problem is unchanged. The problem is controlled. Pertinent negatives include no chest pain, headaches, neck pain, palpitations or shortness of breath. Risk factors for coronary artery disease include dyslipidemia, family history and post-menopausal state. Past treatments include ACE inhibitors and diuretics. The current treatment provides moderate improvement. Compliance problems include diet and exercise.  Hypertensive end-organ damage includes a thyroid problem.  Thyroid Problem Presents for follow-up (hypothyroidism) visit. Patient reports no diarrhea or palpitations. The symptoms have been stable.       Review of Systems  Constitutional: Negative.   HENT: Negative.   Respiratory: Negative for shortness of breath.   Cardiovascular: Negative for chest pain and palpitations.  Gastrointestinal: Negative for diarrhea.  Genitourinary: Negative.   Musculoskeletal: Negative for neck pain.  Neurological: Negative.  Negative for headaches.  Psychiatric/Behavioral: Negative.   All other systems reviewed and are negative.      Objective:   Physical Exam  Constitutional: She is oriented to person, place, and time. She appears well-developed and well-nourished.  HENT:  Nose: Nose normal.  Mouth/Throat: Oropharynx is clear and moist.  Eyes: EOM are normal.  Neck: Trachea normal, normal range of motion and full passive range of motion without pain. Neck supple. No JVD present. Carotid bruit is not present. No thyromegaly present.  Cardiovascular: Normal rate, regular rhythm, normal heart sounds and  intact distal pulses.  Exam reveals no gallop and no friction rub.   No murmur heard. Pulmonary/Chest: Effort normal and breath sounds normal.  Abdominal: Soft. Bowel sounds are normal. She exhibits no distension and no mass. There is no tenderness.  Musculoskeletal: Normal range of motion.  Lymphadenopathy:    She has no cervical adenopathy.  Neurological: She is alert and oriented to person, place, and time. She has normal reflexes.  Skin: Skin is warm and dry.  Psychiatric: She has a normal mood and affect. Her behavior is normal. Judgment and thought content normal.    BP 118/76 mmHg  Pulse 61  Temp(Src) 96.5 F (35.8 C) (Oral)  Ht $R'5\' 1"'Pt$  (1.549 m)  Wt 136 lb (61.689 kg)  BMI 25.71 kg/m2   Left hip x ray- mild osteoarthritis noted-Preliminary reading by Ronnald Collum, FNP  [facility]       Assessment & Plan:  1. Essential hypertension Do not add salt to diet - lisinopril-hydrochlorothiazide (PRINZIDE,ZESTORETIC) 10-12.5 MG per tablet; Take 1 tablet by mouth daily.  Dispense: 90 tablet; Refill: 1 - CMP14+EGFR - Lipid panel  2. Hypothyroidism, unspecified hypothyroidism type - levothyroxine (SYNTHROID, LEVOTHROID) 88 MCG tablet; Take 1 tablet (88 mcg total) by mouth daily.  Dispense: 90 tablet; Refill: 1 - Thyroid Panel With TSH  3. Left hip pain Will try aleve OTC - if doesn't help she will let us know - DG HIP UNILAT W OR W/O PELVIS 2-3 VIEWS LEFT; Future   Continue current meds Continue exercise  RTO prn  Mary-Margaret Hassell Done, FNP

## 2014-11-29 LAB — CMP14+EGFR
ALK PHOS: 87 IU/L (ref 39–117)
ALT: 15 IU/L (ref 0–32)
AST: 16 IU/L (ref 0–40)
Albumin/Globulin Ratio: 2 (ref 1.1–2.5)
Albumin: 4.7 g/dL (ref 3.5–5.5)
BUN/Creatinine Ratio: 16 (ref 9–23)
BUN: 10 mg/dL (ref 6–24)
Bilirubin Total: 1 mg/dL (ref 0.0–1.2)
CO2: 25 mmol/L (ref 18–29)
Calcium: 10.1 mg/dL (ref 8.7–10.2)
Chloride: 98 mmol/L (ref 97–108)
Creatinine, Ser: 0.64 mg/dL (ref 0.57–1.00)
GFR calc Af Amer: 115 mL/min/{1.73_m2} (ref >59)
GFR calc non Af Amer: 100 mL/min/{1.73_m2} (ref >59)
GLOBULIN, TOTAL: 2.4 g/dL (ref 1.5–4.5)
GLUCOSE: 88 mg/dL (ref 65–99)
Potassium: 4.8 mmol/L (ref 3.5–5.2)
Sodium: 140 mmol/L (ref 134–144)
Total Protein: 7.1 g/dL (ref 6.0–8.5)

## 2014-11-29 LAB — LIPID PANEL
CHOL/HDL RATIO: 3.3 ratio (ref 0.0–4.4)
CHOLESTEROL TOTAL: 213 mg/dL — AB (ref 100–199)
HDL: 64 mg/dL (ref >39)
LDL Calculated: 125 mg/dL — ABNORMAL HIGH (ref 0–99)
TRIGLYCERIDES: 118 mg/dL (ref 0–149)
VLDL CHOLESTEROL CAL: 24 mg/dL (ref 5–40)

## 2014-11-29 LAB — THYROID PANEL WITH TSH
Free Thyroxine Index: 2.4 (ref 1.2–4.9)
T3 UPTAKE RATIO: 26 % (ref 24–39)
T4, Total: 9.4 ug/dL (ref 4.5–12.0)
TSH: 5.05 u[IU]/mL — ABNORMAL HIGH (ref 0.450–4.500)

## 2015-01-10 ENCOUNTER — Encounter: Payer: Self-pay | Admitting: Pediatrics

## 2015-01-10 ENCOUNTER — Ambulatory Visit (INDEPENDENT_AMBULATORY_CARE_PROVIDER_SITE_OTHER): Payer: BC Managed Care – PPO | Admitting: Pediatrics

## 2015-01-10 VITALS — BP 124/89 | HR 78 | Temp 98.4°F | Ht 61.0 in | Wt 135.2 lb

## 2015-01-10 DIAGNOSIS — N8110 Cystocele, unspecified: Secondary | ICD-10-CM

## 2015-01-10 NOTE — Patient Instructions (Signed)
Kegel exercises. Empty bladder regularly.

## 2015-01-10 NOTE — Progress Notes (Signed)
    Subjective:    Patient ID: Alison Simmons, female    DOB: 12/21/1955, 59 y.o.   MRN: [id number]  HPI: Alison Simmons is a 59 y.o. female presenting on 01/10/2015 for vaginal discomfort  Has IBS, has had two flares in the past two weeks Feels like something is protruding from the wall of her vagina l"ike a bullfrog". Doesn't hurt. HAs some lower abd pressure. Seems to get worse when she has water. Comes and goes. G Not worse when having a bowel movement. Mostly present for the past 2 weeks, has never had before, largest she has seen is about 1-2 inch. Does not hang out from vagina. Feels it when she is standing up, not usually when she is sitting down. No pain, no vaginal discharge. H/o two vaginal deliveries.  Relevant past medical, surgical, family and social history reviewed and updated as indicated. Interim medical history since our last visit reviewed. Allergies and medications reviewed and updated.   ROS: Per HPI unless specifically indicated above  Past Medical History Patient Active Problem List   Diagnosis Date Noted  . Hypertension 09/10/2012  . Hypothyroidism 09/10/2012    Current Outpatient Prescriptions  Medication Sig Dispense Refill  . aspirin 81 MG tablet Take 81 mg by mouth daily.    Marland Kitchen HYDROcodone-acetaminophen (LORTAB) 5-325 MG per tablet Take 1 tablet by mouth every 6 (six) hours as needed for moderate pain. 30 tablet 0  . levothyroxine (SYNTHROID, LEVOTHROID) 88 MCG tablet Take 1 tablet (88 mcg total) by mouth daily. 90 tablet 1  . lisinopril-hydrochlorothiazide (PRINZIDE,ZESTORETIC) 10-12.5 MG per tablet Take 1 tablet by mouth daily. 90 tablet 1   No current facility-administered medications for this visit.       Objective:    BP 124/89 mmHg  Pulse 78  Temp(Src) 98.4 F (36.9 C) (Oral)  Ht 5\' 1"  (1.549 m)  Wt 135 lb 3.2 oz (61.326 kg)  BMI 25.56 kg/m2  Wt Readings from Last 3 Encounters:  01/10/15 135 lb 3.2 oz (61.326 kg)  11/28/14 136 lb (61.689  kg)  04/19/14 140 lb (63.504 kg)    Gen: NAD, alert, cooperative with exam, NCAT EYES: EOMI, no scleral injection or icterus ENT:  TMs pearly gray b/l, OP without erythema LYMPH: no cervical LAD CV: WWP Resp: normal WOB Abd: +BS, soft, NTND. GU: while standing, has 2cm soft easily reducible protrusion from anterior vaginal wall. When in lithotomy position, still able to feel protrusion, does not come through vaginal opening. Anteriorly placed low cervix within 5-7cm of vaginal opening Ext: No edema, warm Neuro: Alert and oriented     Assessment & Plan:   Alison Simmons was seen today for bladder prolapse. Will refer to gynecology.  Diagnoses and all orders for this visit:  Bladder prolapse, female, acquired -     Ambulatory referral to Gynecology   Follow up plan: Return if symptoms worsen or fail to improve.  Assunta Found, MD [facility] 01/10/2015, 3:57 PM

## 2015-01-15 ENCOUNTER — Telehealth: Payer: Self-pay | Admitting: Pediatrics

## 2015-01-15 ENCOUNTER — Other Ambulatory Visit: Payer: Self-pay | Admitting: *Deleted

## 2015-01-15 DIAGNOSIS — N8110 Cystocele, unspecified: Secondary | ICD-10-CM

## 2015-01-15 NOTE — Progress Notes (Signed)
Patient aware that referral has been made to [facility] and to call back if she has not heard anything within a week.

## 2015-01-31 ENCOUNTER — Encounter: Payer: Self-pay | Admitting: Nurse Practitioner

## 2015-03-14 ENCOUNTER — Telehealth: Payer: Self-pay | Admitting: Nurse Practitioner

## 2015-03-14 NOTE — Telephone Encounter (Signed)
Pt reported

## 2015-06-19 ENCOUNTER — Encounter: Payer: Self-pay | Admitting: Gastroenterology

## 2015-07-31 ENCOUNTER — Ambulatory Visit (INDEPENDENT_AMBULATORY_CARE_PROVIDER_SITE_OTHER): Payer: BC Managed Care – PPO

## 2015-07-31 ENCOUNTER — Encounter: Payer: Self-pay | Admitting: Nurse Practitioner

## 2015-07-31 ENCOUNTER — Ambulatory Visit (INDEPENDENT_AMBULATORY_CARE_PROVIDER_SITE_OTHER): Payer: BC Managed Care – PPO | Admitting: Nurse Practitioner

## 2015-07-31 VITALS — BP 132/76 | HR 78 | Temp 96.9°F | Ht 61.0 in | Wt 137.0 lb

## 2015-07-31 DIAGNOSIS — I10 Essential (primary) hypertension: Secondary | ICD-10-CM

## 2015-07-31 DIAGNOSIS — Z01419 Encounter for gynecological examination (general) (routine) without abnormal findings: Secondary | ICD-10-CM

## 2015-07-31 DIAGNOSIS — Z0000 Encounter for general adult medical examination without abnormal findings: Secondary | ICD-10-CM

## 2015-07-31 DIAGNOSIS — N812 Incomplete uterovaginal prolapse: Secondary | ICD-10-CM

## 2015-07-31 DIAGNOSIS — E039 Hypothyroidism, unspecified: Secondary | ICD-10-CM

## 2015-07-31 DIAGNOSIS — Z1159 Encounter for screening for other viral diseases: Secondary | ICD-10-CM

## 2015-07-31 LAB — URINALYSIS, COMPLETE
Bilirubin, UA: NEGATIVE
Glucose, UA: NEGATIVE
KETONES UA: NEGATIVE
NITRITE UA: NEGATIVE
Protein, UA: NEGATIVE
RBC, UA: NEGATIVE
SPEC GRAV UA: 1.02 (ref 1.005–1.030)
Urobilinogen, Ur: 1 mg/dL (ref 0.2–1.0)
pH, UA: 7 (ref 5.0–7.5)

## 2015-07-31 LAB — MICROSCOPIC EXAMINATION

## 2015-07-31 MED ORDER — LISINOPRIL-HYDROCHLOROTHIAZIDE 10-12.5 MG PO TABS: 1.0000 | ORAL_TABLET | Freq: Every day | ORAL | Status: DC

## 2015-07-31 MED ORDER — LEVOTHYROXINE SODIUM 88 MCG PO TABS: 88.0000 ug | ORAL_TABLET | Freq: Every day | ORAL | Status: DC

## 2015-07-31 MED ORDER — HYDROCODONE-ACETAMINOPHEN 5-325 MG PO TABS: 1.0000 | ORAL_TABLET | Freq: Four times a day (QID) | ORAL | Status: DC | PRN

## 2015-07-31 NOTE — Progress Notes (Addendum)
Subjective:    Patient ID: Alison Simmons, female    DOB: 02-27-1956, 60 y.o.   MRN: [id number]    Patient here today for follow up of chronic medical problems. Wants to have PAP today.  Outpatient Encounter Prescriptions as of 07/31/2015  Medication Sig  . aspirin 81 MG tablet Take 81 mg by mouth daily.  Marland Kitchen HYDROcodone-acetaminophen (LORTAB) 5-325 MG per tablet Take 1 tablet by mouth every 6 (six) hours as needed for moderate pain.  Marland Kitchen levothyroxine (SYNTHROID, LEVOTHROID) 88 MCG tablet Take 1 tablet (88 mcg total) by mouth daily.  Marland Kitchen lisinopril-hydrochlorothiazide (PRINZIDE,ZESTORETIC) 10-12.5 MG per tablet Take 1 tablet by mouth daily.   * Patient says that she saw Dr.Vincent- was diagnosed with prolapsed bladder- was sent to OB/GYN and he gave her option of surgery or pessery- she was fitted for a pessery. Waited several weeks and pessery never arrived so she called the office and they had never ordered it- she had to go back and get refitted - has had about 2 months and developed dysuria so she took it out and has not used it since.   Hypertension This is a chronic problem. The current episode started more than 1 year ago. The problem is unchanged. The problem is controlled. Pertinent negatives include no chest pain, headaches, neck pain, palpitations or shortness of breath. Risk factors for coronary artery disease include dyslipidemia, family history and post-menopausal state. Past treatments include ACE inhibitors and diuretics. The current treatment provides moderate improvement. Compliance problems include diet and exercise.  Hypertensive end-organ damage includes a thyroid problem.  Thyroid Problem Presents for follow-up (hypothyroidism) visit. Patient reports no diarrhea or palpitations. The symptoms have been stable.       Review of Systems  Constitutional: Negative.   HENT: Negative.   Respiratory: Negative for shortness of breath.   Cardiovascular: Negative for chest pain and  palpitations.  Gastrointestinal: Negative for diarrhea.  Genitourinary: Negative.   Musculoskeletal: Negative for neck pain.  Neurological: Negative.  Negative for headaches.  Psychiatric/Behavioral: Negative.   All other systems reviewed and are negative.      Objective:   Physical Exam  Constitutional: She is oriented to person, place, and time. She appears well-developed and well-nourished.  HENT:  Head: Normocephalic.  Right Ear: Hearing, tympanic membrane, external ear and ear canal normal.  Left Ear: Hearing, tympanic membrane, external ear and ear canal normal.  Nose: Nose normal.  Mouth/Throat: Uvula is midline and oropharynx is clear and moist.  Eyes: Conjunctivae and EOM are normal. Pupils are equal, round, and reactive to light.  Neck: Trachea normal, normal range of motion and full passive range of motion without pain. Neck supple. No JVD present. Carotid bruit is not present. No thyroid mass and no thyromegaly present.  Cardiovascular: Normal rate, regular rhythm, normal heart sounds and intact distal pulses.  Exam reveals no gallop and no friction rub.   No murmur heard. Pulmonary/Chest: Effort normal and breath sounds normal. Right breast exhibits no inverted nipple, no mass, no nipple discharge, no skin change and no tenderness. Left breast exhibits no inverted nipple, no mass, no nipple discharge, no skin change and no tenderness.  Abdominal: Soft. Bowel sounds are normal. She exhibits no distension and no mass. There is no tenderness.  Genitourinary: Vagina normal and uterus normal. No breast swelling, tenderness, discharge or bleeding.  bimanual exam-No adnexal masses or tenderness. Uterine prolapse Large cystocele and rectocele  Musculoskeletal: Normal range of motion.  Lymphadenopathy:    She  has no cervical adenopathy.  Neurological: She is alert and oriented to person, place, and time. She has normal reflexes.  Skin: Skin is warm and dry.  Psychiatric: She has  a normal mood and affect. Her behavior is normal. Judgment and thought content normal.    BP 132/76 mmHg  Pulse 78  Temp(Src) 96.9 F (36.1 C) (Oral)  Ht 5' 1" (1.549 m)  Wt 137 lb (62.143 kg)  BMI 25.90 kg/m2  Chest x ray- no cardiopulmonary abnormalities-Preliminary reading by Ronnald Collum, FNP  [facility]  EKG- Kerry Hough, FNP       Assessment & Plan:  1. Annual physical exam - Urinalysis, Complete - CBC with Differential/Platelet  2. Encounter for routine gynecological examination - Pap IG w/ reflex to HPV when ASC-U  3. Essential hypertension Do not add salt to diet - CMP14+EGFR - Lipid panel - DG Chest 2 View; Future - EKG 12-Lead  4. Hypothyroidism, unspecified hypothyroidism type - Thyroid Panel With TSH  5. Need for hepatitis C screening test - Hepatitis C antibody  6. 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    Labs pending Health maintenance reviewed Diet and exercise encouraged Continue all meds Follow up  In 6 month   [place], FNP

## 2015-07-31 NOTE — Addendum Note (Signed)
Addended by: Chevis Pretty on: 07/31/2015 08:50 AM   Modules accepted: Orders, SmartSet

## 2015-07-31 NOTE — Patient Instructions (Signed)

## 2015-07-31 NOTE — Addendum Note (Signed)
Addended by: Chevis Pretty on: 07/31/2015 09:08 AM   Modules accepted: Miquel Dunn

## 2015-08-01 LAB — CMP14+EGFR
A/G RATIO: 1.9 (ref 1.2–2.2)
ALT: 16 IU/L (ref 0–32)
AST: 14 IU/L (ref 0–40)
Albumin: 4.5 g/dL (ref 3.5–5.5)
Alkaline Phosphatase: 84 IU/L (ref 39–117)
BUN/Creatinine Ratio: 18 (ref 9–23)
BUN: 13 mg/dL (ref 6–24)
Bilirubin Total: 0.9 mg/dL (ref 0.0–1.2)
CALCIUM: 9.6 mg/dL (ref 8.7–10.2)
CO2: 26 mmol/L (ref 18–29)
CREATININE: 0.73 mg/dL (ref 0.57–1.00)
Chloride: 97 mmol/L (ref 96–106)
GFR, EST AFRICAN AMERICAN: 106 mL/min/{1.73_m2} (ref >59)
GFR, EST NON AFRICAN AMERICAN: 92 mL/min/{1.73_m2} (ref >59)
GLOBULIN, TOTAL: 2.4 g/dL (ref 1.5–4.5)
Glucose: 94 mg/dL (ref 65–99)
POTASSIUM: 4.6 mmol/L (ref 3.5–5.2)
SODIUM: 139 mmol/L (ref 134–144)
TOTAL PROTEIN: 6.9 g/dL (ref 6.0–8.5)

## 2015-08-01 LAB — CBC WITH DIFFERENTIAL/PLATELET
BASOS: 1 %
Basophils Absolute: 0 10*3/uL (ref 0.0–0.2)
EOS (ABSOLUTE): 0.3 10*3/uL (ref 0.0–0.4)
EOS: 4 %
HEMATOCRIT: 42.1 % (ref 34.0–46.6)
Hemoglobin: 13.8 g/dL (ref 11.1–15.9)
IMMATURE GRANS (ABS): 0 10*3/uL (ref 0.0–0.1)
IMMATURE GRANULOCYTES: 0 %
LYMPHS: 32 %
Lymphocytes Absolute: 2.1 10*3/uL (ref 0.7–3.1)
MCH: 27.4 pg (ref 26.6–33.0)
MCHC: 32.8 g/dL (ref 31.5–35.7)
MCV: 84 fL (ref 79–97)
MONOCYTES: 7 %
Monocytes Absolute: 0.4 10*3/uL (ref 0.1–0.9)
NEUTROS PCT: 56 %
Neutrophils Absolute: 3.7 10*3/uL (ref 1.4–7.0)
Platelets: 255 10*3/uL (ref 150–379)
RBC: 5.03 x10E6/uL (ref 3.77–5.28)
RDW: 14 % (ref 12.3–15.4)
WBC: 6.5 10*3/uL (ref 3.4–10.8)

## 2015-08-01 LAB — HEPATITIS C ANTIBODY: Hep C Virus Ab: <0.1 s/co ratio (ref 0.0–0.9)

## 2015-08-01 LAB — LIPID PANEL
CHOL/HDL RATIO: 3.4 ratio (ref 0.0–4.4)
Cholesterol, Total: 200 mg/dL — ABNORMAL HIGH (ref 100–199)
HDL: 58 mg/dL (ref >39)
LDL CALC: 113 mg/dL — AB (ref 0–99)
Triglycerides: 146 mg/dL (ref 0–149)
VLDL Cholesterol Cal: 29 mg/dL (ref 5–40)

## 2015-08-01 LAB — THYROID PANEL WITH TSH
FREE THYROXINE INDEX: 2.9 (ref 1.2–4.9)
T3 Uptake Ratio: 28 % (ref 24–39)
T4, Total: 10.4 ug/dL (ref 4.5–12.0)
TSH: 1.31 u[IU]/mL (ref 0.450–4.500)

## 2015-08-02 LAB — PAP IG W/ RFLX HPV ASCU: PAP SMEAR COMMENT: 0

## 2015-08-03 ENCOUNTER — Ambulatory Visit (AMBULATORY_SURGERY_CENTER): Payer: Self-pay | Admitting: *Deleted

## 2015-08-03 ENCOUNTER — Telehealth: Payer: Self-pay | Admitting: Nurse Practitioner

## 2015-08-03 VITALS — Ht 61.0 in | Wt 137.0 lb

## 2015-08-03 DIAGNOSIS — Z1211 Encounter for screening for malignant neoplasm of colon: Secondary | ICD-10-CM

## 2015-08-03 MED ORDER — NA SULFATE-K SULFATE-MG SULF 17.5-3.13-1.6 GM/177ML PO SOLN: 1.0000 | Freq: Once | ORAL | Status: DC

## 2015-08-03 NOTE — Telephone Encounter (Signed)
Patient aware that all she will have to do is have husband sign release form so we can get records from dermatology office.

## 2015-08-03 NOTE — Progress Notes (Signed)
No egg or soy allergy known to patient  No issues with past sedation with any surgeries  or procedures, no intubation problems  No diet pills per patient No home 02 use per patient  No blood thinners per patient  Pt denies issues with constipation  emmi declined

## 2015-08-10 ENCOUNTER — Encounter: Payer: Self-pay | Admitting: Gastroenterology

## 2015-08-10 ENCOUNTER — Ambulatory Visit (AMBULATORY_SURGERY_CENTER): Payer: BC Managed Care – PPO | Admitting: Gastroenterology

## 2015-08-10 VITALS — BP 112/70 | HR 60 | Temp 98.9°F | Resp 12 | Ht 61.0 in | Wt 137.0 lb

## 2015-08-10 DIAGNOSIS — D128 Benign neoplasm of rectum: Secondary | ICD-10-CM | POA: Diagnosis not present

## 2015-08-10 DIAGNOSIS — K635 Polyp of colon: Secondary | ICD-10-CM | POA: Diagnosis not present

## 2015-08-10 DIAGNOSIS — D123 Benign neoplasm of transverse colon: Secondary | ICD-10-CM

## 2015-08-10 DIAGNOSIS — Z1211 Encounter for screening for malignant neoplasm of colon: Secondary | ICD-10-CM | POA: Diagnosis not present

## 2015-08-10 MED ORDER — SODIUM CHLORIDE 0.9 % IV SOLN: 500.0000 mL | INTRAVENOUS | Status: DC

## 2015-08-10 NOTE — Progress Notes (Signed)
Called to room to assist during endoscopic procedure.  Patient ID and intended procedure confirmed with present staff. Received instructions for my participation in the procedure from the performing physician.  

## 2015-08-10 NOTE — Op Note (Signed)
[place] Patient Name: Alison Simmons Procedure Date: 08/10/2015 7:55 AM MRN: [id number] Endoscopist: Remo Lipps P. Havery Moros , MD Age: 60 Date of Birth: May 05, 1955 Gender: Female Procedure:                Colonoscopy Indications:              Screening for malignant neoplasm in the colon, This                            is the patient's first colonoscopy Medicines:                Monitored Anesthesia Care Procedure:                Pre-Anesthesia Assessment:                           - Prior to the procedure, a History and Physical                            was performed, and patient medications and                            allergies were reviewed. The patient's tolerance of                            previous anesthesia was also reviewed. The risks                            and benefits of the procedure and the sedation                            options and risks were discussed with the patient.                            All questions were answered, and informed consent                            was obtained. Prior Anticoagulants: The patient has                            taken aspirin, last dose was 1 day prior to                            procedure. ASA Grade Assessment: II - A patient                            with mild systemic disease. After reviewing the                            risks and benefits, the patient was deemed in                            satisfactory condition to undergo the procedure.  After obtaining informed consent, the colonoscope                            was passed under direct vision. Throughout the                            procedure, the patient's blood pressure, pulse, and                            oxygen saturations were monitored continuously. The                            Model PCF-H190L [id number]) scope was introduced                            through the anus and advanced to the the cecum,                     identified by appendiceal orifice and ileocecal                            valve. The colonoscopy was performed without                            difficulty. The patient tolerated the procedure                            well. The quality of the bowel preparation was                            adequate. The terminal ileum, ileocecal valve,                            appendiceal orifice, and rectum were photographed. Scope In: 7:57:55 AM Scope Out: 8:19:44 AM Scope Withdrawal Time: 0 hours 17 minutes 9 seconds  Total Procedure Duration: 0 hours 21 minutes 49 seconds  Findings:                 The perianal and digital rectal examinations were                            normal.                           Scattered medium-mouthed diverticula were found in                            the entire colon.                           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                            Biopsies were taken with a cold forceps for                            histology to ensure no adenomatous change.                           A 4-5 mm polyp was found in the rectum. The polyp                            was sessile. The polyp was removed with a cold                            snare. Resection and retrieval were complete.                           The colon (entire examined portion) was tortuous.                           The terminal ileum appeared normal.                           The exam was otherwise without abnormality. Complications:            No immediate complications. Estimated blood loss:                            Minimal. Estimated Blood Loss:     Estimated blood loss was minimal. Impression:               - Diverticulosis in the entire examined colon.                           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                            Biopsied.                           - One 4-5 mm polyp in the rectum, removed with a                            cold snare. Resected and retrieved.                           - Tortuous colon.                           - The examined portion of the ileum was normal.                           -  The examination was otherwise normal. Recommendation:           - Patient has a contact number available for                            emergencies. The signs and symptoms of potential                            delayed complications were discussed with the                            patient. Return to normal activities tomorrow.                            Written discharge instructions were provided to the                            patient.                           - Resume previous diet.                           - Continue present medications.                           - No aspirin, ibuprofen, naproxen, or other                            non-steroidal anti-inflammatory drugs for 2 weeks                            after polyp removal.                           - Await pathology results.                           - Repeat colonoscopy is recommended for                            surveillance. The colonoscopy date will be                            determined after pathology results from today's                            exam become available for review. Remo Lipps P. Havery Moros, MD 08/10/2015 8:25:02 AM This report has been signed electronically.

## 2015-08-10 NOTE — Progress Notes (Signed)
To recovery, report to McCoy, RN, VSS 

## 2015-08-10 NOTE — Patient Instructions (Signed)
Discharge instructions given. Handouts on polyps and diverticulosis. Resume previous medications. YOU HAD AN ENDOSCOPIC PROCEDURE TODAY AT THE [facility]:   Refer to the procedure report that was given to you for any specific questions about what was found during the examination.  If the procedure report does not answer your questions, please call your gastroenterologist to clarify.  If you requested that your care partner not be given the details of your procedure findings, then the procedure report has been included in a sealed envelope for you to review at your convenience later.  YOU SHOULD EXPECT: Some feelings of bloating in the abdomen. Passage of more gas than usual.  Walking can help get rid of the air that was put into your GI tract during the procedure and reduce the bloating. If you had a lower endoscopy (such as a colonoscopy or flexible sigmoidoscopy) you may notice spotting of blood in your stool or on the toilet paper. If you underwent a bowel prep for your procedure, you may not have a normal bowel movement for a few days.  Please Note:  You might notice some irritation and congestion in your nose or some drainage.  This is from the oxygen used during your procedure.  There is no need for concern and it should clear up in a day or so.  SYMPTOMS TO REPORT IMMEDIATELY:   Following lower endoscopy (colonoscopy or flexible sigmoidoscopy):  Excessive amounts of blood in the stool  Significant tenderness or worsening of abdominal pains  Swelling of the abdomen that is new, acute  Fever of 100F or higher   For urgent or emergent issues, a gastroenterologist can be reached at any hour by calling [phone].   DIET: Your first meal following the procedure should be a small meal and then it is ok to progress to your normal diet. Heavy or fried foods are harder to digest and may make you feel nauseous or bloated.  Likewise, meals heavy in dairy and vegetables can  increase bloating.  Drink plenty of fluids but you should avoid alcoholic beverages for 24 hours.  ACTIVITY:  You should plan to take it easy for the rest of today and you should NOT DRIVE or use heavy machinery until tomorrow (because of the sedation medicines used during the test).    FOLLOW UP: Our staff will call the number listed on your records the next business day following your procedure to check on you and address any questions or concerns that you may have regarding the information given to you following your procedure. If we do not reach you, we will leave a message.  However, if you are feeling well and you are not experiencing any problems, there is no need to return our call.  We will assume that you have returned to your regular daily activities without incident.  If any biopsies were taken you will be contacted by phone or by letter within the next 1-3 weeks.  Please call us at [phone] if you have not heard about the biopsies in 3 weeks.    SIGNATURES/CONFIDENTIALITY: You and/or your care partner have signed paperwork which will be entered into your electronic medical record.  These signatures attest to the fact that that the information above on your After Visit Summary has been reviewed and is understood.  Full responsibility of the confidentiality of this discharge information lies with you and/or your care-partner. 

## 2015-08-13 ENCOUNTER — Telehealth: Payer: Self-pay | Admitting: *Deleted

## 2015-08-13 NOTE — Telephone Encounter (Signed)
  Follow up Call-  Call back number 08/10/2015  Post procedure Call Back phone  # [phone]  Permission to leave phone message Yes     Patient questions:  Do you have a fever, pain , or abdominal swelling? No. Pain Score  0 *  Have you tolerated food without any problems? Yes.    Have you been able to return to your normal activities? Yes.    Do you have any questions about your discharge instructions: Diet   No. Medications  No. Follow up visit  No.  Do you have questions or concerns about your Care? No.  Actions: * If pain score is 4 or above: No action needed, pain <4.

## 2015-08-20 ENCOUNTER — Encounter: Payer: Self-pay | Admitting: Gastroenterology

## 2015-09-11 ENCOUNTER — Encounter: Payer: BC Managed Care – PPO | Admitting: *Deleted

## 2015-11-12 ENCOUNTER — Encounter: Payer: BC Managed Care – PPO | Admitting: *Deleted

## 2016-02-26 ENCOUNTER — Encounter: Payer: Self-pay | Admitting: Nurse Practitioner

## 2016-02-26 ENCOUNTER — Ambulatory Visit (INDEPENDENT_AMBULATORY_CARE_PROVIDER_SITE_OTHER): Payer: BC Managed Care – PPO | Admitting: Nurse Practitioner

## 2016-02-26 VITALS — BP 144/92 | HR 61 | Temp 96.9°F | Ht 61.0 in | Wt 138.4 lb

## 2016-02-26 DIAGNOSIS — I10 Essential (primary) hypertension: Secondary | ICD-10-CM | POA: Diagnosis not present

## 2016-02-26 DIAGNOSIS — E039 Hypothyroidism, unspecified: Secondary | ICD-10-CM

## 2016-02-26 MED ORDER — LEVOTHYROXINE SODIUM 88 MCG PO TABS: 88.0000 ug | ORAL_TABLET | Freq: Every day | ORAL | 1 refills | Status: DC

## 2016-02-26 MED ORDER — LISINOPRIL-HYDROCHLOROTHIAZIDE 10-12.5 MG PO TABS: 1.0000 | ORAL_TABLET | Freq: Every day | ORAL | 1 refills | Status: DC

## 2016-02-26 NOTE — Progress Notes (Signed)
Subjective:    Patient ID: Alison Simmons, female    DOB: 01/25/1956, 60 y.o.   MRN: [id number]    Patient here today for follow up of chronic medical problems. She report being fitted with the pessary and it is working well for her. She denies any pelvic pain or dysuria.  Outpatient Encounter Prescriptions as of 07/31/2015  Medication Sig  . aspirin 81 MG tablet Take 81 mg by mouth daily.  Marland Kitchen HYDROcodone-acetaminophen (LORTAB) 5-325 MG per tablet Take 1 tablet by mouth every 6 (six) hours as needed for moderate pain.  Marland Kitchen levothyroxine (SYNTHROID, LEVOTHROID) 88 MCG tablet Take 1 tablet (88 mcg total) by mouth daily.  Marland Kitchen lisinopril-hydrochlorothiazide (PRINZIDE,ZESTORETIC) 10-12.5 MG per tablet Take 1 tablet by mouth daily.    Hypertension  This is a chronic problem. The current episode started more than 1 year ago. The problem is unchanged. The problem is controlled. Pertinent negatives include no chest pain, headaches, neck pain, palpitations or shortness of breath. Risk factors for coronary artery disease include dyslipidemia, family history and post-menopausal state. Past treatments include ACE inhibitors and diuretics. The current treatment provides moderate improvement. Compliance problems include diet and exercise.  Hypertensive end-organ damage includes a thyroid problem.  Thyroid Problem  Presents for follow-up (hypothyroidism) visit. Patient reports no diarrhea or palpitations. The symptoms have been stable.       Review of Systems  Constitutional: Negative.   HENT: Negative.   Respiratory: Negative for shortness of breath.   Cardiovascular: Negative for chest pain and palpitations.  Gastrointestinal: Negative for diarrhea.  Genitourinary: Negative.   Musculoskeletal: Negative for neck pain.  Neurological: Negative.  Negative for headaches.  Psychiatric/Behavioral: Negative.   All other systems reviewed and are negative.      Objective:   Physical Exam  Constitutional: She  is oriented to person, place, and time. She appears well-developed and well-nourished.  HENT:  Head: Normocephalic.  Right Ear: Hearing, tympanic membrane, external ear and ear canal normal.  Left Ear: Hearing, tympanic membrane, external ear and ear canal normal.  Nose: Nose normal.  Mouth/Throat: Uvula is midline and oropharynx is clear and moist.  Eyes: Conjunctivae and EOM are normal. Pupils are equal, round, and reactive to light.  Neck: Trachea normal, normal range of motion and full passive range of motion without pain. Neck supple. No JVD present. Carotid bruit is not present. No thyroid mass and no thyromegaly present.  Cardiovascular: Normal rate, regular rhythm, normal heart sounds and intact distal pulses.  Exam reveals no gallop and no friction rub.   No murmur heard. Pulmonary/Chest: Effort normal and breath sounds normal. Right breast exhibits no inverted nipple, no mass, no nipple discharge, no skin change and no tenderness. Left breast exhibits no inverted nipple, no mass, no nipple discharge, no skin change and no tenderness.  Abdominal: Soft. Bowel sounds are normal. She exhibits no distension and no mass. There is no tenderness.  Genitourinary: Vagina normal. No breast swelling, tenderness, discharge or bleeding.  Musculoskeletal: Normal range of motion.  Lymphadenopathy:    She has no cervical adenopathy.  Neurological: She is alert and oriented to person, place, and time. She has normal reflexes.  Skin: Skin is warm and dry.  Psychiatric: She has a normal mood and affect. Her behavior is normal. Judgment and thought content normal.   BP (!) 144/92 (BP Location: Left Arm, Cuff Size: Normal)   Pulse 61   Temp (!) 96.9 F (36.1 C) (Oral)   Ht '5\' 1"'$  (1.549  m)   Wt 138 lb 6 oz (62.8 kg)   BMI 26.15 kg/m      Assessment & Plan:  1. Essential hypertension Low salt diet, do not add extra salt to meals Keep a diary of your blood pressure and bring it with you at your  next visit. - lisinopril-hydrochlorothiazide (PRINZIDE,ZESTORETIC) 10-12.5 MG tablet; Take 1 tablet by mouth daily.  Dispense: 90 tablet; Refill: 1 - CMP14+EGFR  2. Hypothyroidism, unspecified type  - levothyroxine (SYNTHROID, LEVOTHROID) 88 MCG tablet; Take 1 tablet (88 mcg total) by mouth daily.  Dispense: 90 tablet; Refill: 1  - TSH level  Labs pending Health maintenance reviewed Diet and exercise encouraged monthds Continue all meds Follow up  in [place], RN,BSN, [place], FNP

## 2016-02-27 LAB — TSH: TSH: 0.559 u[IU]/mL (ref 0.450–4.500)

## 2016-05-04 ENCOUNTER — Other Ambulatory Visit: Payer: Self-pay | Admitting: Nurse Practitioner

## 2016-05-04 DIAGNOSIS — E039 Hypothyroidism, unspecified: Secondary | ICD-10-CM

## 2016-05-08 ENCOUNTER — Encounter: Payer: Self-pay | Admitting: Nurse Practitioner

## 2016-05-09 ENCOUNTER — Other Ambulatory Visit: Payer: Self-pay | Admitting: Nurse Practitioner

## 2016-05-09 DIAGNOSIS — I10 Essential (primary) hypertension: Secondary | ICD-10-CM

## 2016-05-09 MED ORDER — LISINOPRIL-HYDROCHLOROTHIAZIDE 10-12.5 MG PO TABS: 1.0000 | ORAL_TABLET | Freq: Every day | ORAL | 1 refills | Status: DC

## 2016-07-22 ENCOUNTER — Encounter: Payer: Self-pay | Admitting: Nurse Practitioner

## 2016-08-08 ENCOUNTER — Other Ambulatory Visit: Payer: Self-pay | Admitting: Nurse Practitioner

## 2016-08-08 MED ORDER — ALPRAZOLAM 0.25 MG PO TABS: ORAL_TABLET | ORAL | 0 refills | Status: DC

## 2016-08-08 NOTE — Progress Notes (Signed)
Please call in xanax 0.25 1 po prn flying #[phone]  with 0 refills

## 2016-10-07 ENCOUNTER — Encounter: Payer: Self-pay | Admitting: Nurse Practitioner

## 2016-10-07 ENCOUNTER — Ambulatory Visit (INDEPENDENT_AMBULATORY_CARE_PROVIDER_SITE_OTHER): Payer: BC Managed Care – PPO | Admitting: Nurse Practitioner

## 2016-10-07 VITALS — BP 126/84 | HR 77 | Temp 97.1°F | Ht 61.0 in | Wt 139.0 lb

## 2016-10-07 DIAGNOSIS — E039 Hypothyroidism, unspecified: Secondary | ICD-10-CM

## 2016-10-07 DIAGNOSIS — R300 Dysuria: Secondary | ICD-10-CM

## 2016-10-07 DIAGNOSIS — Z6826 Body mass index (BMI) 26.0-26.9, adult: Secondary | ICD-10-CM | POA: Diagnosis not present

## 2016-10-07 DIAGNOSIS — I10 Essential (primary) hypertension: Secondary | ICD-10-CM | POA: Diagnosis not present

## 2016-10-07 LAB — MICROSCOPIC EXAMINATION
RBC, UA: NONE SEEN /hpf (ref 0–?)
Renal Epithel, UA: NONE SEEN /hpf

## 2016-10-07 LAB — URINALYSIS, COMPLETE
Bilirubin, UA: NEGATIVE
GLUCOSE, UA: NEGATIVE
Ketones, UA: NEGATIVE
Leukocytes, UA: NEGATIVE
Nitrite, UA: NEGATIVE
PROTEIN UA: NEGATIVE
RBC, UA: NEGATIVE
Specific Gravity, UA: 1.01 (ref 1.005–1.030)
UUROB: 0.2 mg/dL (ref 0.2–1.0)
pH, UA: 7 (ref 5.0–7.5)

## 2016-10-07 MED ORDER — LEVOTHYROXINE SODIUM 88 MCG PO TABS: 88.0000 ug | ORAL_TABLET | Freq: Every day | ORAL | 1 refills | Status: DC

## 2016-10-07 MED ORDER — LISINOPRIL-HYDROCHLOROTHIAZIDE 10-12.5 MG PO TABS: 1.0000 | ORAL_TABLET | Freq: Every day | ORAL | 1 refills | Status: DC

## 2016-10-07 MED ORDER — HYDROCODONE-ACETAMINOPHEN 5-325 MG PO TABS: 1.0000 | ORAL_TABLET | Freq: Four times a day (QID) | ORAL | 0 refills | Status: DC | PRN

## 2016-10-07 NOTE — Progress Notes (Signed)
Subjective:    Patient ID: Alison Simmons, female    DOB: 1956-02-22, 61 y.o.   MRN: [id number]  HPI Kimberly Nieland is here today for follow up of chronic medical problem.  Outpatient Encounter Prescriptions as of 10/07/2016  Medication Sig  . ALPRAZolam (XANAX) 0.25 MG tablet 1 po prn flying  . aspirin 81 MG tablet Take 81 mg by mouth daily.  Marland Kitchen HYDROcodone-acetaminophen (LORTAB) 5-325 MG tablet Take 1 tablet by mouth every 6 (six) hours as needed for moderate pain.  Marland Kitchen levothyroxine (SYNTHROID, LEVOTHROID) 88 MCG tablet Take 1 tablet (88 mcg total) by mouth daily.  Marland Kitchen levothyroxine (SYNTHROID, LEVOTHROID) 88 MCG tablet TAKE 1 TABLET DAILY  . lisinopril-hydrochlorothiazide (PRINZIDE,ZESTORETIC) 10-12.5 MG tablet Take 1 tablet by mouth daily.   No facility-administered encounter medications on file as of 10/07/2016.     1. Essential hypertension  Patient manages with combination lisinopril-HCTZ.  Patient checks blood pressure daily and it runs 110-120/80s.  2. Hypothyroidism, unspecified type  Managed with levothyroxine.    New complaints: Patient is having some low back pain that has been present since about March when she retired and has been much more active.  She saw Dr. French Ana at [facility] and her x-ray was negative for spinal issues.  He recommended a steroid injection and prescribed Mobic.  She took and the Mobic and that helped, so she did not return for the injection.  About 2.5 weeks ago, patient was painting in her garage and when she went to sit on a rolling stool, she hit her back on a ledge and had a large bruise on her back.    Patient has a history of prolapsed bladder and use of pessary device.  She states she has been having burning when she urinates for about 1-2 weeks and was wanting to be sure the back pain is not a bladder infection before returning to see Dr. French Ana.    Review of Systems  Constitutional: Negative.   Genitourinary: Positive for dysuria (1-2  weeks).  Musculoskeletal: Positive for back pain (since March).  All other systems reviewed and are negative.      Objective:   Physical Exam  Constitutional: She is oriented to person, place, and time. She appears well-developed and well-nourished. No distress.  HENT:  Head: Normocephalic.  Right Ear: External ear normal.  Left Ear: External ear normal.  Mouth/Throat: Oropharynx is clear and moist.  Eyes: Pupils are equal, round, and reactive to light.  Neck: Normal range of motion. Neck supple. No JVD present. No thyromegaly present.  Cardiovascular: Normal rate, regular rhythm, normal heart sounds and intact distal pulses.   No murmur heard. Pulmonary/Chest: Effort normal and breath sounds normal. No respiratory distress.  Abdominal: Soft. Bowel sounds are normal. She exhibits no distension. There is no tenderness.  Musculoskeletal: Normal range of motion. She exhibits no edema.  Lymphadenopathy:    She has no cervical adenopathy.  Neurological: She is alert and oriented to person, place, and time.  Skin: Skin is warm and dry.  Psychiatric: She has a normal mood and affect. Her behavior is normal. Judgment and thought content normal.   BP 126/84   Pulse 77   Temp 97.1 F (36.2 C) (Oral)   Ht _0  (1.549 m)   Wt 139 lb (63 kg)   BMI 26.26 kg/m       Assessment & Plan:  1. Dysuria Urine is clear - Urinalysis, Complete  2. Essential hypertension Low sodium diet - lisinopril-hydrochlorothiazide (  PRINZIDE,ZESTORETIC) 10-12.5 MG tablet; Take 1 tablet by mouth daily.  Dispense: 90 tablet; Refill: 1 - CMP14+EGFR - Lipid panel  3. Hypothyroidism, unspecified type - levothyroxine (SYNTHROID, LEVOTHROID) 88 MCG tablet; Take 1 tablet (88 mcg total) by mouth daily.  Dispense: 90 tablet; Refill: 1 - Thyroid Panel With TSH  4. BMI 26.0-26.9,adult Discussed diet and exercise for person with BMI >25 Will recheck weight in 3-6 months    Labs pending Health maintenance  reviewed Diet and exercise encouraged Continue all meds Follow up  In 6 months   [place], FNP

## 2016-10-07 NOTE — Addendum Note (Signed)
Addended by: Rolena Infante on: 10/07/2016 12:53 PM   Modules accepted: Orders

## 2016-10-08 LAB — CMP14+EGFR
ALT: 17 IU/L (ref 0–32)
AST: 19 IU/L (ref 0–40)
Albumin/Globulin Ratio: 2.1 (ref 1.2–2.2)
Albumin: 4.9 g/dL (ref 3.5–5.5)
Alkaline Phosphatase: 95 IU/L (ref 39–117)
BUN/Creatinine Ratio: 13 (ref 9–23)
BUN: 11 mg/dL (ref 6–24)
Bilirubin Total: 0.7 mg/dL (ref 0.0–1.2)
CO2: 23 mmol/L (ref 20–29)
Calcium: 10.1 mg/dL (ref 8.7–10.2)
Chloride: 99 mmol/L (ref 96–106)
Creatinine, Ser: 0.83 mg/dL (ref 0.57–1.00)
GFR, EST AFRICAN AMERICAN: 90 mL/min/{1.73_m2} (ref >59)
GFR, EST NON AFRICAN AMERICAN: 78 mL/min/{1.73_m2} (ref >59)
Globulin, Total: 2.3 g/dL (ref 1.5–4.5)
Glucose: 96 mg/dL (ref 65–99)
Potassium: 4 mmol/L (ref 3.5–5.2)
Sodium: 140 mmol/L (ref 134–144)
TOTAL PROTEIN: 7.2 g/dL (ref 6.0–8.5)

## 2016-10-08 LAB — THYROID PANEL WITH TSH
FREE THYROXINE INDEX: 2.6 (ref 1.2–4.9)
T3 UPTAKE RATIO: 28 % (ref 24–39)
T4, Total: 9.2 ug/dL (ref 4.5–12.0)
TSH: 3.15 u[IU]/mL (ref 0.450–4.500)

## 2017-04-29 ENCOUNTER — Other Ambulatory Visit: Payer: Self-pay | Admitting: *Deleted

## 2017-04-29 DIAGNOSIS — E039 Hypothyroidism, unspecified: Secondary | ICD-10-CM

## 2017-04-29 MED ORDER — LEVOTHYROXINE SODIUM 88 MCG PO TABS: 88.0000 ug | ORAL_TABLET | Freq: Every day | ORAL | 1 refills | Status: DC

## 2017-06-04 ENCOUNTER — Encounter: Payer: Self-pay | Admitting: Nurse Practitioner

## 2017-06-04 ENCOUNTER — Ambulatory Visit (INDEPENDENT_AMBULATORY_CARE_PROVIDER_SITE_OTHER): Payer: BC Managed Care – PPO | Admitting: Nurse Practitioner

## 2017-06-04 ENCOUNTER — Ambulatory Visit (INDEPENDENT_AMBULATORY_CARE_PROVIDER_SITE_OTHER): Payer: BC Managed Care – PPO

## 2017-06-04 VITALS — BP 101/75 | HR 90 | Temp 97.2°F | Ht 61.0 in | Wt 138.0 lb

## 2017-06-04 DIAGNOSIS — E039 Hypothyroidism, unspecified: Secondary | ICD-10-CM

## 2017-06-04 DIAGNOSIS — K5900 Constipation, unspecified: Secondary | ICD-10-CM

## 2017-06-04 DIAGNOSIS — G43009 Migraine without aura, not intractable, without status migrainosus: Secondary | ICD-10-CM

## 2017-06-04 DIAGNOSIS — I10 Essential (primary) hypertension: Secondary | ICD-10-CM

## 2017-06-04 DIAGNOSIS — Z0000 Encounter for general adult medical examination without abnormal findings: Secondary | ICD-10-CM | POA: Diagnosis not present

## 2017-06-04 DIAGNOSIS — Z0001 Encounter for general adult medical examination with abnormal findings: Secondary | ICD-10-CM | POA: Diagnosis not present

## 2017-06-04 DIAGNOSIS — Z6826 Body mass index (BMI) 26.0-26.9, adult: Secondary | ICD-10-CM

## 2017-06-04 LAB — URINALYSIS, COMPLETE
Bilirubin, UA: NEGATIVE
GLUCOSE, UA: NEGATIVE
Ketones, UA: NEGATIVE
Leukocytes, UA: NEGATIVE
Nitrite, UA: NEGATIVE
PROTEIN UA: NEGATIVE
RBC, UA: NEGATIVE
Specific Gravity, UA: 1.02 (ref 1.005–1.030)
Urobilinogen, Ur: 0.2 mg/dL (ref 0.2–1.0)
pH, UA: 6 (ref 5.0–7.5)

## 2017-06-04 LAB — MICROSCOPIC EXAMINATION
BACTERIA UA: NONE SEEN
RBC, UA: NONE SEEN /hpf (ref 0–?)
Renal Epithel, UA: NONE SEEN /hpf

## 2017-06-04 MED ORDER — LISINOPRIL-HYDROCHLOROTHIAZIDE 10-12.5 MG PO TABS: 1.0000 | ORAL_TABLET | Freq: Every day | ORAL | 1 refills | Status: DC

## 2017-06-04 MED ORDER — LEVOTHYROXINE SODIUM 88 MCG PO TABS: 88.0000 ug | ORAL_TABLET | Freq: Every day | ORAL | 1 refills | Status: DC

## 2017-06-04 MED ORDER — SUMATRIPTAN SUCCINATE 50 MG PO TABS: 50.0000 mg | ORAL_TABLET | ORAL | 0 refills | Status: DC | PRN

## 2017-06-04 MED ORDER — LINACLOTIDE 145 MCG PO CAPS: 145.0000 ug | ORAL_CAPSULE | Freq: Every day | ORAL | 0 refills | Status: DC

## 2017-06-04 NOTE — Patient Instructions (Signed)

## 2017-06-04 NOTE — Progress Notes (Addendum)
Subjective:    Patient ID: Alison Simmons, female    DOB: 23-Oct-1955, 62 y.o.   MRN: [id number]  HPI   Alison Simmons is here today for annual physical exam (no PAP) follow up of chronic medical problem.  Outpatient Encounter Medications as of 06/04/2017  Medication Sig  . ALPRAZolam (XANAX) 0.25 MG tablet 1 po prn flying  . aspirin 81 MG tablet Take 81 mg by mouth daily.  Marland Kitchen HYDROcodone-acetaminophen (LORTAB) 5-325 MG tablet Take 1 tablet by mouth every 6 (six) hours as needed for moderate pain.  Marland Kitchen levothyroxine (SYNTHROID, LEVOTHROID) 88 MCG tablet Take 1 tablet (88 mcg total) by mouth daily.  Marland Kitchen lisinopril-hydrochlorothiazide (PRINZIDE,ZESTORETIC) 10-12.5 MG tablet Take 1 tablet by mouth daily.     1. Essential hypertension  No c/o chest pain, sob or headache. Doe snot check blood pressure at home. BP Readings from Last 3 Encounters:  10/07/16 126/84  02/26/16 (!) 144/92  08/10/15 112/70     2. Hypothyroidism, unspecified type  No problems that she is aware of.  3. BMI 26.0-26.9,adult  No recent weight changes    New complaints: Has problems with constipation. Eats a lot of fiber and still goes 3-4 days without having bowel movement. Has history of migraines- she has had off and on for years. Started having frequently in the last 2 months. She wants something to take other then pain meds when occurs [place] history[place]  [place]   Review of Systems  Constitutional: Negative for activity change and appetite change.  HENT: Negative.   Eyes: Negative for pain.  Respiratory: Negative for shortness of breath.   Cardiovascular: Negative for chest pain, palpitations and leg swelling.  Gastrointestinal: Negative for abdominal pain.  Endocrine: Negative for polydipsia.  Genitourinary: Negative.   Skin: Negative for rash.  Neurological: Negative for dizziness, weakness and headaches.  Hematological: Does not bruise/bleed easily.  Psychiatric/Behavioral:  Negative.   All other systems reviewed and are negative.      Objective:   Physical Exam  Constitutional: She is oriented to person, place, and time. She appears well-developed and well-nourished.  HENT:  Nose: Nose normal.  Mouth/Throat: Oropharynx is clear and moist.  Eyes: EOM are normal.  Neck: Trachea normal, normal range of motion and full passive range of motion without pain. Neck supple. No JVD present. Carotid bruit is not present. No thyromegaly present.  Cardiovascular: Normal rate, regular rhythm, normal heart sounds and intact distal pulses. Exam reveals no gallop and no friction rub.  No murmur heard. Pulmonary/Chest: Effort normal and breath sounds normal.  Abdominal: Soft. Bowel sounds are normal. She exhibits no distension and no mass. There is no tenderness.  Musculoskeletal: Normal range of motion.  Lymphadenopathy:    She has no cervical adenopathy.  Neurological: She is alert and oriented to person, place, and time. She has normal reflexes.  Skin: Skin is warm and dry.  Psychiatric: She has a normal mood and affect. Her behavior is normal. Judgment and thought content normal.    BP 101/75   Pulse 90   Temp (!) 97.2 F (36.2 C) (Oral)   Ht _0  (1.549 m)   Wt 138 lb (62.6 kg)   BMI 26.07 kg/m   Chest x ray- no cardiopulmonary abnormalities-Preliminary reading by Ronnald Collum, FNP  [facility]   EKG-      Assessment & Plan:  1. Essential hypertension Low sodium diet - CMP14+EGFR - Lipid panel - lisinopril-hydrochlorothiazide (PRINZIDE,ZESTORETIC) 10-12.5 MG tablet;  Take 1 tablet by mouth daily.  Dispense: 90 tablet; Refill: 1  2. Hypothyroidism, unspecified type Labs pending - Thyroid Panel With TSH - levothyroxine (SYNTHROID, LEVOTHROID) 88 MCG tablet; Take 1 tablet (88 mcg total) by mouth daily.  Dispense: 90 tablet; Refill: 1  3. BMI 26.0-26.9,adult Discussed diet and exercise for person with BMI >25 Will recheck weight in 3-6 months  4. Annual  physical exam - Urinalysis, Complete - DG Chest 2 View; Future - EKG 12-Lead - CBC with Differential/Platelet  5. Migraine without aura and without status migrainosus, not intractable Avoid caffeine - SUMAtriptan (IMITREX) 50 MG tablet; Take 1 tablet (50 mg total) by mouth every 2 (two) hours as needed for migraine. May repeat in 2 hours if headache persists or recurs.  Dispense: 10 tablet; Refill: 0  6. Constipation, unspecified constipation type Increase fiber in diet - linaclotide (LINZESS) 145 MCG CAPS capsule; Take 1 capsule (145 mcg total) by mouth daily before breakfast.  Dispense: 30 capsule; Refill: 0    Labs pending Health maintenance reviewed Diet and exercise encouraged Continue all meds Follow up  In 6 months   [place], FNP

## 2017-06-05 LAB — CMP14+EGFR
A/G RATIO: 2 (ref 1.2–2.2)
ALK PHOS: 102 IU/L (ref 39–117)
ALT: 16 IU/L (ref 0–32)
AST: 26 IU/L (ref 0–40)
Albumin: 4.9 g/dL (ref 3.5–5.5)
BUN/Creatinine Ratio: 20 (ref 9–23)
BUN: 17 mg/dL (ref 6–24)
Bilirubin Total: 1 mg/dL (ref 0.0–1.2)
CHLORIDE: 96 mmol/L (ref 96–106)
CO2: 25 mmol/L (ref 20–29)
Calcium: 10.5 mg/dL — ABNORMAL HIGH (ref 8.7–10.2)
Creatinine, Ser: 0.86 mg/dL (ref 0.57–1.00)
GFR calc Af Amer: 86 mL/min/{1.73_m2} (ref >59)
GFR calc non Af Amer: 74 mL/min/{1.73_m2} (ref >59)
GLUCOSE: 94 mg/dL (ref 65–99)
Globulin, Total: 2.5 g/dL (ref 1.5–4.5)
POTASSIUM: 4.6 mmol/L (ref 3.5–5.2)
Sodium: 138 mmol/L (ref 134–144)
Total Protein: 7.4 g/dL (ref 6.0–8.5)

## 2017-06-05 LAB — CBC WITH DIFFERENTIAL/PLATELET
BASOS: 1 %
Basophils Absolute: 0.1 10*3/uL (ref 0.0–0.2)
EOS (ABSOLUTE): 0.3 10*3/uL (ref 0.0–0.4)
EOS: 3 %
HEMATOCRIT: 44.3 % (ref 34.0–46.6)
Hemoglobin: 14.5 g/dL (ref 11.1–15.9)
Immature Grans (Abs): 0 10*3/uL (ref 0.0–0.1)
Immature Granulocytes: 0 %
LYMPHS ABS: 2.5 10*3/uL (ref 0.7–3.1)
Lymphs: 28 %
MCH: 28 pg (ref 26.6–33.0)
MCHC: 32.7 g/dL (ref 31.5–35.7)
MCV: 86 fL (ref 79–97)
MONOS ABS: 0.6 10*3/uL (ref 0.1–0.9)
Monocytes: 7 %
Neutrophils Absolute: 5.4 10*3/uL (ref 1.4–7.0)
Neutrophils: 61 %
Platelets: 264 10*3/uL (ref 150–379)
RBC: 5.17 x10E6/uL (ref 3.77–5.28)
RDW: 13.5 % (ref 12.3–15.4)
WBC: 8.8 10*3/uL (ref 3.4–10.8)

## 2017-06-05 LAB — LIPID PANEL
Chol/HDL Ratio: 3.3 ratio (ref 0.0–4.4)
Cholesterol, Total: 199 mg/dL (ref 100–199)
HDL: 60 mg/dL (ref >39)
LDL Calculated: 113 mg/dL — ABNORMAL HIGH (ref 0–99)
Triglycerides: 128 mg/dL (ref 0–149)
VLDL Cholesterol Cal: 26 mg/dL (ref 5–40)

## 2017-06-05 LAB — THYROID PANEL WITH TSH
FREE THYROXINE INDEX: 3 (ref 1.2–4.9)
T3 UPTAKE RATIO: 29 % (ref 24–39)
T4, Total: 10.2 ug/dL (ref 4.5–12.0)
TSH: 1.58 u[IU]/mL (ref 0.450–4.500)

## 2017-07-21 ENCOUNTER — Other Ambulatory Visit: Payer: Self-pay | Admitting: Nurse Practitioner

## 2017-07-21 DIAGNOSIS — I10 Essential (primary) hypertension: Secondary | ICD-10-CM

## 2017-12-08 ENCOUNTER — Ambulatory Visit: Payer: BC Managed Care – PPO | Admitting: Nurse Practitioner

## 2017-12-08 ENCOUNTER — Encounter: Payer: Self-pay | Admitting: Nurse Practitioner

## 2017-12-08 VITALS — BP 124/74 | HR 76 | Temp 98.0°F | Ht 61.0 in | Wt 138.0 lb

## 2017-12-08 DIAGNOSIS — E039 Hypothyroidism, unspecified: Secondary | ICD-10-CM | POA: Diagnosis not present

## 2017-12-08 DIAGNOSIS — G43109 Migraine with aura, not intractable, without status migrainosus: Secondary | ICD-10-CM | POA: Diagnosis not present

## 2017-12-08 DIAGNOSIS — I10 Essential (primary) hypertension: Secondary | ICD-10-CM

## 2017-12-08 DIAGNOSIS — F40243 Fear of flying: Secondary | ICD-10-CM

## 2017-12-08 DIAGNOSIS — Z23 Encounter for immunization: Secondary | ICD-10-CM | POA: Diagnosis not present

## 2017-12-08 DIAGNOSIS — Z6826 Body mass index (BMI) 26.0-26.9, adult: Secondary | ICD-10-CM | POA: Diagnosis not present

## 2017-12-08 MED ORDER — ALPRAZOLAM 0.25 MG PO TABS
ORAL_TABLET | ORAL | 0 refills | Status: DC
Start: 2017-12-08 — End: 2017-12-08

## 2017-12-08 MED ORDER — HYDROCODONE-ACETAMINOPHEN 5-325 MG PO TABS: 1.0000 | ORAL_TABLET | Freq: Four times a day (QID) | ORAL | 0 refills | Status: DC | PRN

## 2017-12-08 MED ORDER — LISINOPRIL-HYDROCHLOROTHIAZIDE 10-12.5 MG PO TABS: 1.0000 | ORAL_TABLET | Freq: Every day | ORAL | 1 refills | Status: DC

## 2017-12-08 MED ORDER — ALPRAZOLAM 0.25 MG PO TABS: ORAL_TABLET | ORAL | 0 refills | Status: AC

## 2017-12-08 MED ORDER — LEVOTHYROXINE SODIUM 88 MCG PO TABS
88.0000 ug | ORAL_TABLET | Freq: Every day | ORAL | 1 refills | Status: DC
Start: 2017-12-08 — End: 2017-12-08

## 2017-12-08 MED ORDER — LEVOTHYROXINE SODIUM 88 MCG PO TABS: 88.0000 ug | ORAL_TABLET | Freq: Every day | ORAL | 1 refills | Status: DC

## 2017-12-08 NOTE — Patient Instructions (Signed)

## 2017-12-08 NOTE — Addendum Note (Signed)
Addended by: Chevis Pretty on: 12/08/2017 03:35 PM   Modules accepted: Orders

## 2017-12-08 NOTE — Addendum Note (Signed)
Addended by: Rolena Infante on: 12/08/2017 04:14 PM   Modules accepted: Orders

## 2017-12-08 NOTE — Progress Notes (Signed)
Subjective:    Patient ID: Alison Simmons, female    DOB: 07/17/1955, 62 y.o.   MRN: [id number]   Chief Complaint: Medical Management of Chronic Issues   HPI:  1. Essential hypertension  No c/o chest pain, sob or headache. Does not check blood pressure at home  2. Hypothyroidism, unspecified type  No problems  3. BMI 26.0-26.9,adult  No recent weight changes    Outpatient Encounter Medications as of 12/08/2017  Medication Sig  . aspirin 81 MG tablet Take 81 mg by mouth daily.  Marland Kitchen levothyroxine (SYNTHROID, LEVOTHROID) 88 MCG tablet Take 1 tablet (88 mcg total) by mouth daily.  Marland Kitchen linaclotide (LINZESS) 145 MCG CAPS capsule Take 1 capsule (145 mcg total) by mouth daily before breakfast.  . lisinopril-hydrochlorothiazide (PRINZIDE,ZESTORETIC) 10-12.5 MG tablet TAKE 1 TABLET BY MOUTH EVERY DAY  . ALPRAZolam (XANAX) 0.25 MG tablet 1 po prn flying (Patient not taking: Reported on 12/08/2017)  . HYDROcodone-acetaminophen (LORTAB) 5-325 MG tablet Take 1 tablet by mouth every 6 (six) hours as needed for moderate pain. (Patient not taking: Reported on 12/08/2017)       New complaints: - gad -when flying- getting ready to fly to [place] - migraines- was given imitrex that made her feel terrible. She would like to just go back to lortab when needed. Only has about 1 a month  Social history: She lives at [employer] now.   Review of Systems  Constitutional: Negative for activity change and appetite change.  HENT: Negative.   Eyes: Negative for pain.  Respiratory: Negative for shortness of breath.   Cardiovascular: Negative for chest pain, palpitations and leg swelling.  Gastrointestinal: Negative for abdominal pain.  Endocrine: Negative for polydipsia.  Genitourinary: Negative.   Skin: Negative for rash.  Neurological: Negative for dizziness, weakness and headaches.  Hematological: Does not bruise/bleed easily.  Psychiatric/Behavioral: Negative.   All other systems reviewed and are  negative.      Objective:   Physical Exam  Constitutional: She is oriented to person, place, and time. She appears well-developed and well-nourished. No distress.  HENT:  Head: Normocephalic.  Nose: Nose normal.  Mouth/Throat: Oropharynx is clear and moist.  Eyes: Pupils are equal, round, and reactive to light. EOM are normal.  Neck: Normal range of motion. Neck supple. No JVD present. Carotid bruit is not present.  Cardiovascular: Normal rate, regular rhythm, normal heart sounds and intact distal pulses.  Pulmonary/Chest: Effort normal and breath sounds normal. No respiratory distress. She has no wheezes. She has no rales. She exhibits no tenderness.  Abdominal: Soft. Normal appearance, normal aorta and bowel sounds are normal. She exhibits no distension, no abdominal bruit, no pulsatile midline mass and no mass. There is no splenomegaly or hepatomegaly. There is no tenderness.  Musculoskeletal: Normal range of motion. She exhibits no edema.  Lymphadenopathy:    She has no cervical adenopathy.  Neurological: She is alert and oriented to person, place, and time. She has normal reflexes.  Skin: Skin is warm and dry.  Psychiatric: She has a normal mood and affect. Her behavior is normal. Judgment and thought content normal.  Nursing note and vitals reviewed.   BP 124/74   Pulse 76   Temp 98 F (36.7 C) (Oral)   Ht _0  (1.549 m)   Wt 138 lb (62.6 kg)   BMI 26.07 kg/m        Assessment & Plan:  Alison Simmons comes in today with chief complaint of Medical Management of Chronic Issues  Diagnosis and orders addressed:  1. Essential hypertension Low sodium diet - CMP14+EGFR - Lipid panel - lisinopril-hydrochlorothiazide (PRINZIDE,ZESTORETIC) 10-12.5 MG tablet; Take 1 tablet by mouth daily.  Dispense: 90 tablet; Refill: 1  2. Hypothyroidism, unspecified type Labs pending - Thyroid Panel With TSH - levothyroxine (SYNTHROID, LEVOTHROID) 88 MCG tablet; Take 1 tablet (88 mcg  total) by mouth daily.  Dispense: 90 tablet; Refill: 1  3. BMI 26.0-26.9,adult Discussed diet and exercise for person with BMI >25 Will recheck weight in 3-6 months  4. Migraine with aura and without status migrainosus, not intractable Keep diary of events - HYDROcodone-acetaminophen (LORTAB) 5-325 MG tablet; Take 1 tablet by mouth every 6 (six) hours as needed for moderate pain.  Dispense: 30 tablet; Refill: 0  5. Anxiety with flying - ALPRAZolam (XANAX) 0.25 MG tablet; 1 po prn flying  Dispense: 10 tablet; Refill: 0   Labs pending Health Maintenance reviewed Diet and exercise encouraged  Follow up plan: 1 year and prn   Mary-Margaret Hassell Done, FNP

## 2017-12-09 LAB — THYROID PANEL WITH TSH
Free Thyroxine Index: 2.5 (ref 1.2–4.9)
T3 Uptake Ratio: 28 % (ref 24–39)
T4, Total: 8.9 ug/dL (ref 4.5–12.0)
TSH: 0.876 u[IU]/mL (ref 0.450–4.500)

## 2017-12-09 LAB — CMP14+EGFR
ALBUMIN: 4.9 g/dL (ref 3.5–5.5)
ALK PHOS: 86 IU/L (ref 39–117)
ALT: 16 IU/L (ref 0–32)
AST: 20 IU/L (ref 0–40)
Albumin/Globulin Ratio: 1.8 (ref 1.2–2.2)
BILIRUBIN TOTAL: 0.5 mg/dL (ref 0.0–1.2)
BUN / CREAT RATIO: 22 (ref 9–23)
BUN: 16 mg/dL (ref 6–24)
CHLORIDE: 100 mmol/L (ref 96–106)
CO2: 26 mmol/L (ref 20–29)
CREATININE: 0.73 mg/dL (ref 0.57–1.00)
Calcium: 10.1 mg/dL (ref 8.7–10.2)
GFR calc non Af Amer: 90 mL/min/{1.73_m2} (ref >59)
GFR, EST AFRICAN AMERICAN: 104 mL/min/{1.73_m2} (ref >59)
GLOBULIN, TOTAL: 2.7 g/dL (ref 1.5–4.5)
Glucose: 96 mg/dL (ref 65–99)
Potassium: 4.5 mmol/L (ref 3.5–5.2)
SODIUM: 141 mmol/L (ref 134–144)
Total Protein: 7.6 g/dL (ref 6.0–8.5)

## 2017-12-09 LAB — LIPID PANEL
CHOLESTEROL TOTAL: 221 mg/dL — AB (ref 100–199)
Chol/HDL Ratio: 3.9 ratio (ref 0.0–4.4)
HDL: 56 mg/dL (ref >39)
LDL CALC: 136 mg/dL — AB (ref 0–99)
Triglycerides: 145 mg/dL (ref 0–149)
VLDL CHOLESTEROL CAL: 29 mg/dL (ref 5–40)

## 2018-05-23 ENCOUNTER — Other Ambulatory Visit: Payer: Self-pay | Admitting: Nurse Practitioner

## 2018-05-23 DIAGNOSIS — E039 Hypothyroidism, unspecified: Secondary | ICD-10-CM

## 2018-06-26 ENCOUNTER — Other Ambulatory Visit: Payer: Self-pay | Admitting: Nurse Practitioner

## 2018-06-26 DIAGNOSIS — I10 Essential (primary) hypertension: Secondary | ICD-10-CM

## 2018-07-04 ENCOUNTER — Other Ambulatory Visit: Payer: Self-pay | Admitting: Nurse Practitioner

## 2018-07-04 DIAGNOSIS — I10 Essential (primary) hypertension: Secondary | ICD-10-CM

## 2018-07-05 MED ORDER — LISINOPRIL-HYDROCHLOROTHIAZIDE 10-12.5 MG PO TABS: ORAL_TABLET | ORAL | 0 refills | Status: DC

## 2018-07-05 NOTE — Telephone Encounter (Signed)
Refill failed, resent 

## 2018-07-05 NOTE — Addendum Note (Signed)
Addended by: Antonietta Barcelona D on: 07/05/2018 10:18 AM   Modules accepted: Orders

## 2018-08-25 ENCOUNTER — Other Ambulatory Visit: Payer: Self-pay | Admitting: *Deleted

## 2018-09-15 IMAGING — DX DG CHEST 2V
2 series · 2 of 2 positions shown · non-contrast
Comparison: Chest x-ray dated 07/31/2015.

CLINICAL DATA: Annual physical exam

EXAM:
CHEST  2 VIEW

[chest pa]
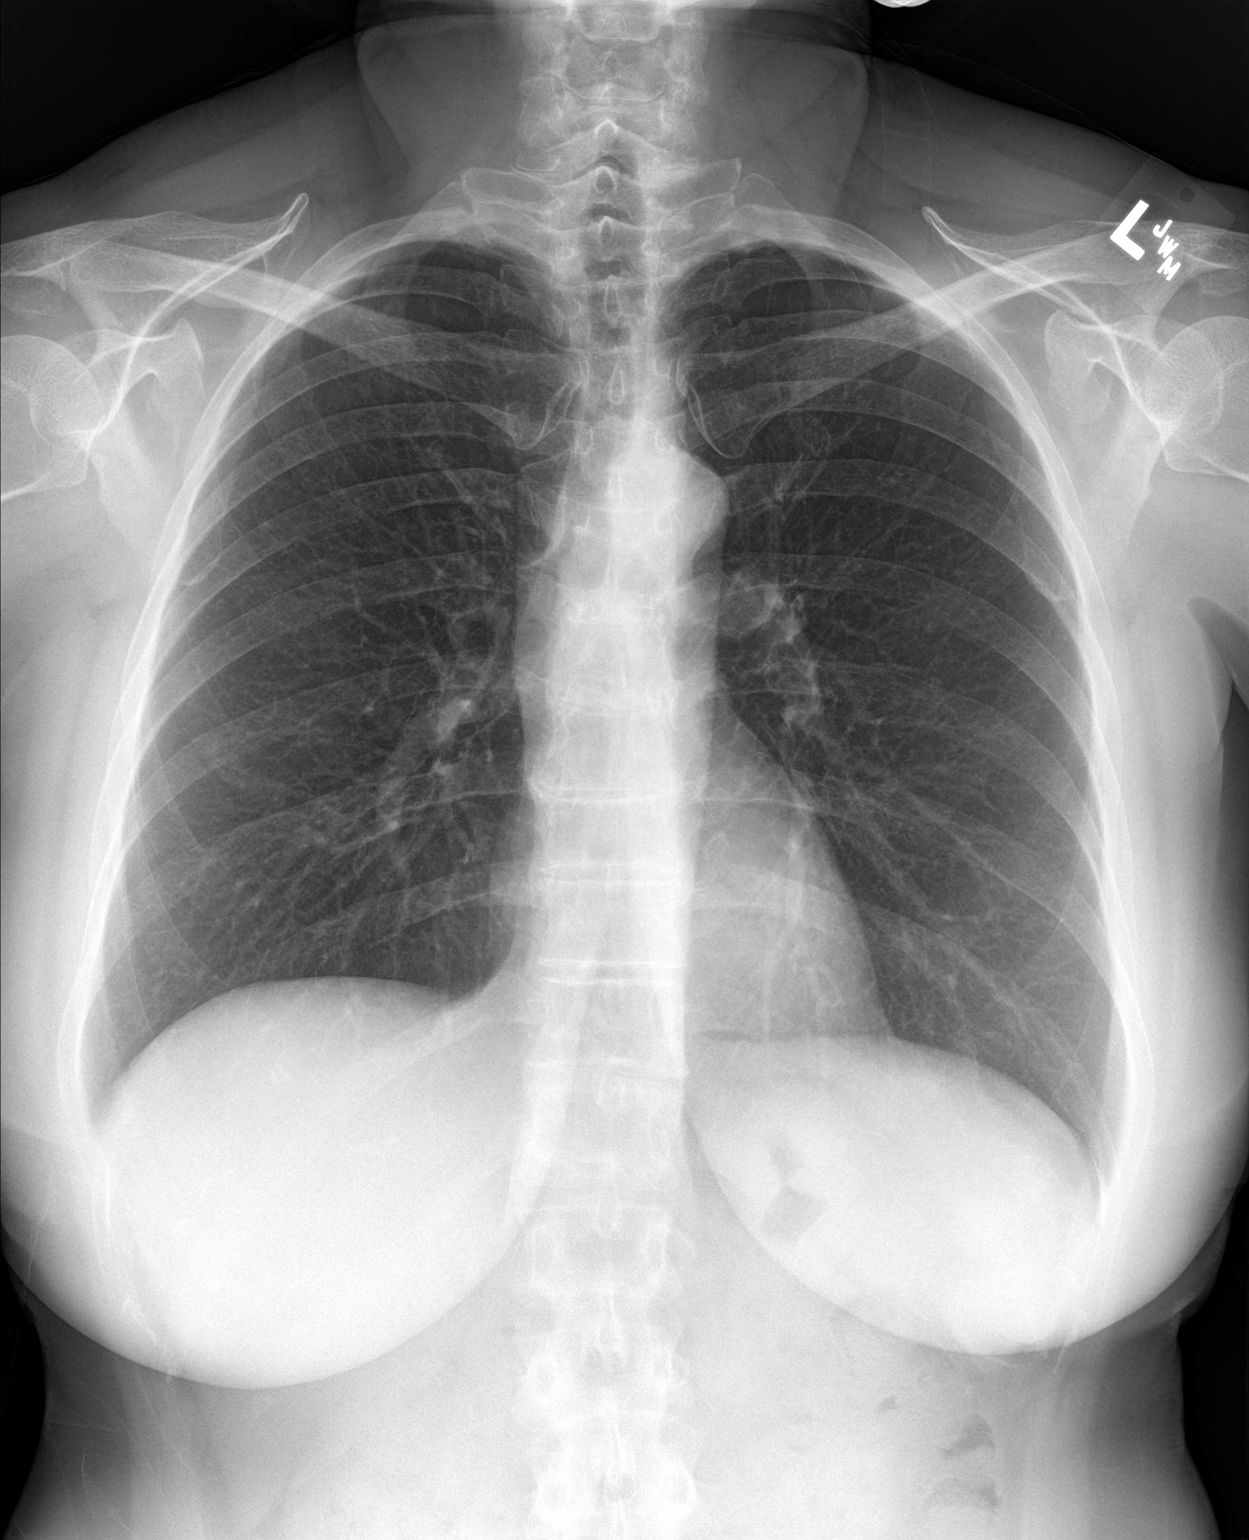

[chest lat]
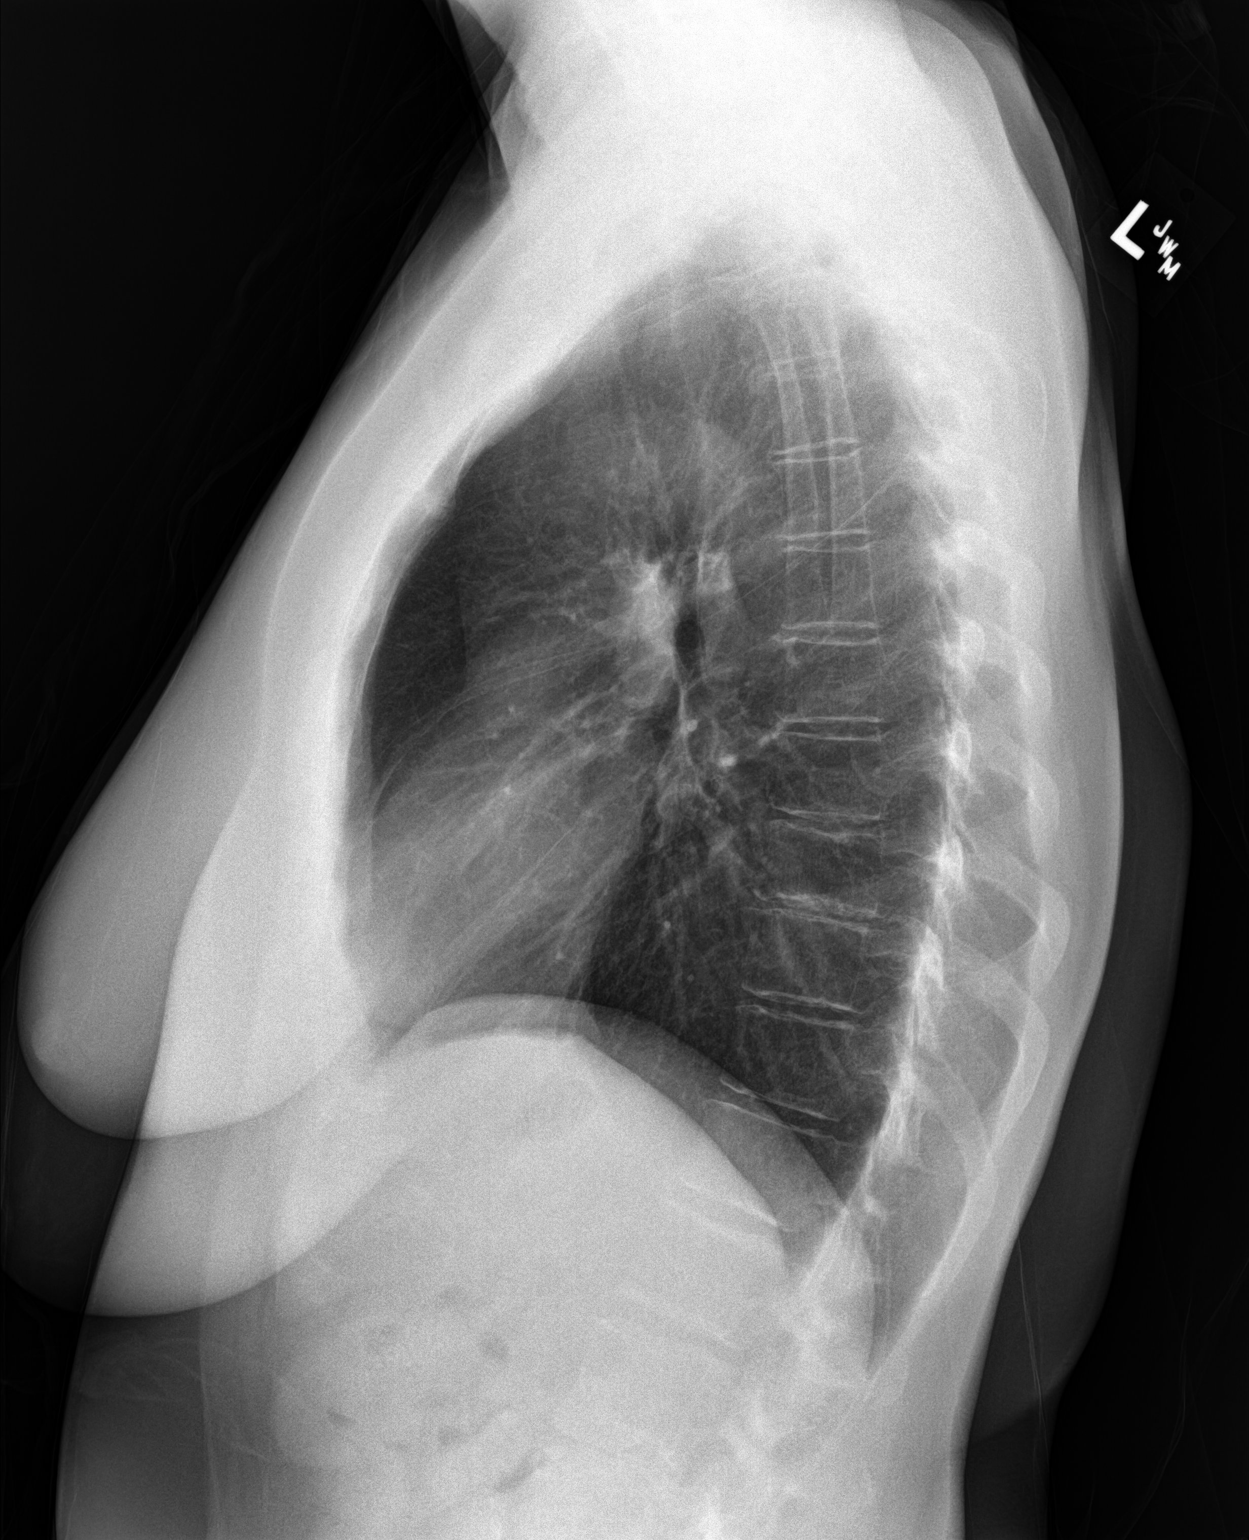

[2 of 2 positions shown; findings below may reference images not displayed]

FINDINGS: The heart size and mediastinal contours are within normal limits.
Both lungs are clear. The visualized skeletal structures are
unremarkable.
IMPRESSION: No active cardiopulmonary disease.

## 2018-11-23 ENCOUNTER — Other Ambulatory Visit: Payer: Self-pay

## 2018-11-24 ENCOUNTER — Ambulatory Visit (INDEPENDENT_AMBULATORY_CARE_PROVIDER_SITE_OTHER): Payer: BC Managed Care – PPO | Admitting: Nurse Practitioner

## 2018-11-24 ENCOUNTER — Encounter: Payer: Self-pay | Admitting: Nurse Practitioner

## 2018-11-24 ENCOUNTER — Other Ambulatory Visit: Payer: Self-pay | Admitting: Nurse Practitioner

## 2018-11-24 VITALS — BP 159/89 | HR 76 | Temp 98.0°F | Ht 61.0 in | Wt 134.0 lb

## 2018-11-24 DIAGNOSIS — I10 Essential (primary) hypertension: Secondary | ICD-10-CM | POA: Diagnosis not present

## 2018-11-24 DIAGNOSIS — Z0001 Encounter for general adult medical examination with abnormal findings: Secondary | ICD-10-CM

## 2018-11-24 DIAGNOSIS — E039 Hypothyroidism, unspecified: Secondary | ICD-10-CM

## 2018-11-24 DIAGNOSIS — Z6826 Body mass index (BMI) 26.0-26.9, adult: Secondary | ICD-10-CM

## 2018-11-24 DIAGNOSIS — K5904 Chronic idiopathic constipation: Secondary | ICD-10-CM

## 2018-11-24 DIAGNOSIS — L218 Other seborrheic dermatitis: Secondary | ICD-10-CM

## 2018-11-24 DIAGNOSIS — Z0000 Encounter for general adult medical examination without abnormal findings: Secondary | ICD-10-CM

## 2018-11-24 LAB — URINALYSIS, COMPLETE
Bilirubin, UA: NEGATIVE
Glucose, UA: NEGATIVE
Leukocytes,UA: NEGATIVE
Nitrite, UA: NEGATIVE
Protein,UA: NEGATIVE
Specific Gravity, UA: 1.02 (ref 1.005–1.030)
Urobilinogen, Ur: 0.2 mg/dL (ref 0.2–1.0)
pH, UA: 6 (ref 5.0–7.5)

## 2018-11-24 LAB — MICROSCOPIC EXAMINATION
Bacteria, UA: NONE SEEN
Renal Epithel, UA: NONE SEEN /hpf

## 2018-11-24 MED ORDER — HYDROCORTISONE 2.5 % EX CREA: TOPICAL_CREAM | Freq: Two times a day (BID) | CUTANEOUS | 3 refills | Status: DC

## 2018-11-24 MED ORDER — KETOCONAZOLE 2 % EX SHAM: 1.0000 "application " | MEDICATED_SHAMPOO | CUTANEOUS | 12 refills | Status: DC

## 2018-11-24 MED ORDER — LISINOPRIL-HYDROCHLOROTHIAZIDE 10-12.5 MG PO TABS: ORAL_TABLET | ORAL | 1 refills | Status: DC

## 2018-11-24 MED ORDER — LEVOTHYROXINE SODIUM 88 MCG PO TABS: 88.0000 ug | ORAL_TABLET | Freq: Every day | ORAL | 3 refills | Status: DC

## 2018-11-24 MED ORDER — LEVOTHYROXINE SODIUM 88 MCG PO TABS: 88.0000 ug | ORAL_TABLET | Freq: Every day | ORAL | 1 refills | Status: DC

## 2018-11-24 MED ORDER — LISINOPRIL-HYDROCHLOROTHIAZIDE 10-12.5 MG PO TABS: ORAL_TABLET | ORAL | 3 refills | Status: AC

## 2018-11-24 NOTE — Addendum Note (Signed)
Addended by: Rolena Infante on: 11/24/2018 12:34 PM   Modules accepted: Orders

## 2018-11-24 NOTE — Patient Instructions (Signed)

## 2018-11-24 NOTE — Progress Notes (Signed)
Subjective:    Patient ID: Alison Simmons, female    DOB: 03/24/1956, 63 y.o.   MRN: [id number]   Chief Complaint: Annual Exam    HPI:  1. Essential hypertension No c/o chest pain, sob or headache. Does not check blood pressure at home. BP Readings from Last 3 Encounters:  12/08/17 124/74  06/04/17 101/75  10/07/16 126/84     2. Hypothyroidism, unspecified type No problems that she is aware of  3. BMI 26.0-26.9,adult No recent weight chnages  4. Chronic idiopathic constipation Stopped taking her linzess. Says that she is doing ok    Outpatient Encounter Medications as of 11/24/2018  Medication Sig  . ALPRAZolam (XANAX) 0.25 MG tablet 1 po prn flying  . aspirin 81 MG tablet Take 81 mg by mouth daily.  Marland Kitchen levothyroxine (SYNTHROID, LEVOTHROID) 88 MCG tablet TAKE 1 TABLET BY MOUTH EVERY DAY  . linaclotide (LINZESS) 145 MCG CAPS capsule Take 1 capsule (145 mcg total) by mouth daily before breakfast.  . lisinopril-hydrochlorothiazide (PRINZIDE,ZESTORETIC) 10-12.5 MG tablet TAKE 1 TABLET BY MOUTH DAILY (NEEDS APT BEFORE NEXT REFILL)     Past Surgical History:  Procedure Laterality Date  . APPENDECTOMY    . VAGINAL DELIVERY     x2    Family History  Problem Relation Age of Onset  . COPD Mother   . Diabetes Mother   . Hypertension Mother   . Hyperlipidemia Mother   . Diabetes Father   . Colon cancer Neg Hx   . Esophageal cancer Neg Hx   . Rectal cancer Neg Hx   . Stomach cancer Neg Hx   . Colon polyps Neg Hx     New complaints: None today  [place]  Controlled substance contract: N/A    Review of Systems  Constitutional: Negative for activity change and appetite change.  HENT: Negative.   Eyes: Negative for pain.  Respiratory: Negative for shortness of breath.   Cardiovascular: Negative for chest pain, palpitations and leg swelling.  Gastrointestinal: Negative for abdominal pain.  Endocrine: Negative for polydipsia.   Genitourinary: Negative.   Skin: Negative for rash.  Neurological: Negative for dizziness, weakness and headaches.  Hematological: Does not bruise/bleed easily.  Psychiatric/Behavioral: Negative.   All other systems reviewed and are negative.      Objective:   Physical Exam Vitals signs and nursing note reviewed.  Constitutional:      General: She is not in acute distress.    Appearance: Normal appearance. She is well-developed.  HENT:     Head: Normocephalic.     Nose: Nose normal.  Eyes:     Pupils: Pupils are equal, round, and reactive to light.  Neck:     Musculoskeletal: Normal range of motion and neck supple.     Vascular: No carotid bruit or JVD.  Cardiovascular:     Rate and Rhythm: Normal rate and regular rhythm.     Heart sounds: Normal heart sounds.  Pulmonary:     Effort: Pulmonary effort is normal. No respiratory distress.     Breath sounds: Normal breath sounds. No wheezing or rales.  Chest:     Chest wall: No tenderness.  Abdominal:     General: Bowel sounds are normal. There is no distension or abdominal bruit.     Palpations: Abdomen is soft. There is no hepatomegaly, splenomegaly, mass or pulsatile mass.     Tenderness: There is no abdominal tenderness.  Musculoskeletal: Normal range of motion.  Lymphadenopathy:  Cervical: No cervical adenopathy.  Skin:    General: Skin is warm and dry.  Neurological:     Mental Status: She is alert and oriented to person, place, and time.     Deep Tendon Reflexes: Reflexes are normal and symmetric.  Psychiatric:        Behavior: Behavior normal.        Thought Content: Thought content normal.        Judgment: Judgment normal.     BP (!) 159/89   Pulse 76   Temp 98 F (36.7 C) (Oral)   Ht '5\' 1"'$  (1.549 m)   Wt 134 lb (60.8 kg)   BMI 25.32 kg/m           Assessment & Plan:  Alison Simmons comes in today with chief complaint of Annual Exam   Diagnosis and orders addressed:  1. Annual physical exam  - CBC with Differential/Platelet - IGP, Aptima HPV, rfx 16/18,45  2. Essential hypertension Low sodium diet - CMP14+EGFR - Lipid panel - lisinopril-hydrochlorothiazide (ZESTORETIC) 10-12.5 MG tablet; TAKE 1 TABLET BY MOUTH DAILY (NEEDS APT BEFORE NEXT REFILL)  Dispense: 90 tablet; Refill: 1  3. Hypothyroidism, unspecified type - Thyroid Panel With TSH - levothyroxine (SYNTHROID) 88 MCG tablet; Take 1 tablet (88 mcg total) by mouth daily.  Dispense: 90 tablet; Refill: 1  4. BMI 26.0-26.9,adult Discussed diet and exercise for person with BMI >25 Will recheck weight in 3-6 months  5. Chronic idiopathic constipation Continue with increase fiber in diet  6. Seborrhea corporis - ketoconazole (NIZORAL) 2 % shampoo; Apply 1 application topically 2 (two) times a week.  Dispense: 120 mL; Refill: 12 - hydrocortisone 2.5 % cream; Apply topically 2 (two) times daily.  Dispense: 30 g; Refill: 3   Labs pending Health Maintenance reviewed Diet and exercise encouraged  Follow up plan: 1 year    [place], FNP

## 2018-11-25 LAB — CBC WITH DIFFERENTIAL/PLATELET
Basophils Absolute: 0.1 10*3/uL (ref 0.0–0.2)
Basos: 1 %
EOS (ABSOLUTE): 0.1 10*3/uL (ref 0.0–0.4)
Eos: 2 %
Hematocrit: 42.7 % (ref 34.0–46.6)
Hemoglobin: 14.1 g/dL (ref 11.1–15.9)
Immature Grans (Abs): 0 10*3/uL (ref 0.0–0.1)
Immature Granulocytes: 0 %
Lymphocytes Absolute: 2.3 10*3/uL (ref 0.7–3.1)
Lymphs: 31 %
MCH: 27.6 pg (ref 26.6–33.0)
MCHC: 33 g/dL (ref 31.5–35.7)
MCV: 84 fL (ref 79–97)
Monocytes Absolute: 0.5 10*3/uL (ref 0.1–0.9)
Monocytes: 7 %
Neutrophils Absolute: 4.4 10*3/uL (ref 1.4–7.0)
Neutrophils: 59 %
Platelets: 214 10*3/uL (ref 150–450)
RBC: 5.1 x10E6/uL (ref 3.77–5.28)
RDW: 12.3 % (ref 11.7–15.4)
WBC: 7.4 10*3/uL (ref 3.4–10.8)

## 2018-11-25 LAB — LIPID PANEL
Chol/HDL Ratio: 3.7 ratio (ref 0.0–4.4)
Cholesterol, Total: 202 mg/dL — ABNORMAL HIGH (ref 100–199)
HDL: 55 mg/dL (ref >39)
LDL Calculated: 124 mg/dL — ABNORMAL HIGH (ref 0–99)
Triglycerides: 113 mg/dL (ref 0–149)
VLDL Cholesterol Cal: 23 mg/dL (ref 5–40)

## 2018-11-25 LAB — CMP14+EGFR
ALT: 11 IU/L (ref 0–32)
AST: 20 IU/L (ref 0–40)
Albumin/Globulin Ratio: 2.3 — ABNORMAL HIGH (ref 1.2–2.2)
Albumin: 4.9 g/dL (ref 3.8–4.9)
Alkaline Phosphatase: 84 IU/L (ref 39–117)
BUN/Creatinine Ratio: 15 (ref 12–28)
BUN: 11 mg/dL (ref 8–27)
Bilirubin Total: 1.2 mg/dL (ref 0.0–1.2)
CO2: 25 mmol/L (ref 20–29)
Calcium: 10.1 mg/dL (ref 8.7–10.3)
Chloride: 99 mmol/L (ref 96–106)
Creatinine, Ser: 0.73 mg/dL (ref 0.57–1.00)
GFR calc Af Amer: 104 mL/min/{1.73_m2} (ref >59)
GFR calc non Af Amer: 90 mL/min/{1.73_m2} (ref >59)
Globulin, Total: 2.1 g/dL (ref 1.5–4.5)
Glucose: 80 mg/dL (ref 65–99)
Potassium: 4.8 mmol/L (ref 3.5–5.2)
Sodium: 141 mmol/L (ref 134–144)
Total Protein: 7 g/dL (ref 6.0–8.5)

## 2018-11-25 LAB — THYROID PANEL WITH TSH
Free Thyroxine Index: 3.3 (ref 1.2–4.9)
T3 Uptake Ratio: 32 % (ref 24–39)
T4, Total: 10.3 ug/dL (ref 4.5–12.0)
TSH: 0.123 u[IU]/mL — ABNORMAL LOW (ref 0.450–4.500)

## 2018-11-25 MED ORDER — LEVOTHYROXINE SODIUM 75 MCG PO TABS: 75.0000 ug | ORAL_TABLET | Freq: Every day | ORAL | 3 refills | Status: DC

## 2018-11-25 NOTE — Addendum Note (Signed)
Addended by: Chevis Pretty on: 11/25/2018 12:47 PM   Modules accepted: Orders

## 2018-11-27 LAB — IGP, APTIMA HPV, RFX 16/18,45: HPV Aptima: NEGATIVE

## 2020-08-09 ENCOUNTER — Encounter: Payer: Self-pay | Admitting: Gastroenterology

## 2022-02-21 DIAGNOSIS — N814 Uterovaginal prolapse, unspecified: Secondary | ICD-10-CM | POA: Insufficient documentation

## 2022-02-21 DIAGNOSIS — N8112 Cystocele, lateral: Secondary | ICD-10-CM | POA: Insufficient documentation

## 2022-05-21 ENCOUNTER — Encounter: Payer: Self-pay | Admitting: Gastroenterology

## 2022-06-26 ENCOUNTER — Ambulatory Visit (AMBULATORY_SURGERY_CENTER): Payer: Self-pay

## 2022-06-26 ENCOUNTER — Encounter: Payer: Self-pay | Admitting: Gastroenterology

## 2022-06-26 VITALS — Ht 61.0 in | Wt 138.0 lb

## 2022-06-26 DIAGNOSIS — Z86010 Personal history of colonic polyps: Secondary | ICD-10-CM

## 2022-06-26 MED ORDER — NA SULFATE-K SULFATE-MG SULF 17.5-3.13-1.6 GM/177ML PO SOLN: 1.0000 | Freq: Once | ORAL | 0 refills | Status: AC

## 2022-06-26 NOTE — Progress Notes (Signed)
No egg or soy allergy known to patient  No issues known to pt with past sedation with any surgeries or procedures Patient denies ever being told they had issues or difficulty with intubation  No FH of Malignant Hyperthermia Pt is not on diet pills Pt is not on  home 02  Pt is not on blood thinners  Pt with constipation reports going having a BM 3-4 times a week but doesn't have to strain is just hard for her to go. Stools are reported to be hard. Pt advised to increase her water intake and activity prior to colposcopy as well as start miralax a week prior as directed in her prep instructions  No A fib or A flutter Have any cardiac testing pending--no  Pt instructed to use Singlecare.com or GoodRx for a price reduction on prep   Patient's chart reviewed by Osvaldo Angst CNRA prior to previsit and patient appropriate for the [facility].  Previsit completed and red dot placed by patient's name on their procedure day (on provider's schedule).

## 2022-07-28 ENCOUNTER — Other Ambulatory Visit: Payer: Self-pay

## 2022-07-28 ENCOUNTER — Telehealth: Payer: Self-pay | Admitting: Gastroenterology

## 2022-07-28 ENCOUNTER — Encounter: Payer: BC Managed Care – PPO | Admitting: Gastroenterology

## 2022-07-28 ENCOUNTER — Encounter: Payer: Self-pay | Admitting: Gastroenterology

## 2022-07-28 DIAGNOSIS — Z86010 Personal history of colonic polyps: Secondary | ICD-10-CM

## 2022-07-28 NOTE — Telephone Encounter (Signed)
Inbound call from patient, rescheduled her procedure for 5/6 at 3:00 PM. Patient needs new ambulatory referral.

## 2022-07-28 NOTE — Telephone Encounter (Signed)
Attempted to return pt call to clarify what she is asking for. VM left letting the pt know a new AMB referral with the update procedure date was put in and to call us back if this is not what she called about so we may further assist her. New prep sent to the pt as well via mychart and hard copy via mail.

## 2022-08-18 ENCOUNTER — Ambulatory Visit (AMBULATORY_SURGERY_CENTER): Payer: BC Managed Care – PPO | Admitting: Gastroenterology

## 2022-08-18 ENCOUNTER — Encounter: Payer: Self-pay | Admitting: Gastroenterology

## 2022-08-18 VITALS — BP 103/66 | HR 80 | Temp 97.7°F | Resp 15 | Ht 61.0 in | Wt 137.8 lb

## 2022-08-18 DIAGNOSIS — Z09 Encounter for follow-up examination after completed treatment for conditions other than malignant neoplasm: Secondary | ICD-10-CM

## 2022-08-18 DIAGNOSIS — Z86010 Personal history of colonic polyps: Secondary | ICD-10-CM | POA: Diagnosis not present

## 2022-08-18 MED ORDER — SODIUM CHLORIDE 0.9 % IV SOLN
500.0000 mL | Freq: Once | INTRAVENOUS | Status: DC
Start: 2022-08-18 — End: 2022-08-18

## 2022-08-18 NOTE — Patient Instructions (Signed)
Handout on diverticulosis provided.  Continue present medications.  Resume previous diet.  Repeat colonoscopy in 10 years for screening purposes.  YOU HAD AN ENDOSCOPIC PROCEDURE TODAY AT THE [facility]:   Refer to the procedure report that was given to you for any specific questions about what was found during the examination.  If the procedure report does not answer your questions, please call your gastroenterologist to clarify.  If you requested that your care partner not be given the details of your procedure findings, then the procedure report has been included in a sealed envelope for you to review at your convenience later.  YOU SHOULD EXPECT: Some feelings of bloating in the abdomen. Passage of more gas than usual.  Walking can help get rid of the air that was put into your GI tract during the procedure and reduce the bloating. If you had a lower endoscopy (such as a colonoscopy or flexible sigmoidoscopy) you may notice spotting of blood in your stool or on the toilet paper. If you underwent a bowel prep for your procedure, you may not have a normal bowel movement for a few days.  Please Note:  You might notice some irritation and congestion in your nose or some drainage.  This is from the oxygen used during your procedure.  There is no need for concern and it should clear up in a day or so.  SYMPTOMS TO REPORT IMMEDIATELY:  Following lower endoscopy (colonoscopy or flexible sigmoidoscopy):  Excessive amounts of blood in the stool  Significant tenderness or worsening of abdominal pains  Swelling of the abdomen that is new, acute  Fever of 100F or higher   For urgent or emergent issues, a gastroenterologist can be reached at any hour by calling [phone]. Do not use MyChart messaging for urgent concerns.    DIET:  We do recommend a small meal at first, but then you may proceed to your regular diet.  Drink plenty of fluids but you should avoid alcoholic beverages  for 24 hours.  ACTIVITY:  You should plan to take it easy for the rest of today and you should NOT DRIVE or use heavy machinery until tomorrow (because of the sedation medicines used during the test).    FOLLOW UP: Our staff will call the number listed on your records the next business day following your procedure.  We will call around 7:15- 8:00 am to check on you and address any questions or concerns that you may have regarding the information given to you following your procedure. If we do not reach you, we will leave a message.     If any biopsies were taken you will be contacted by phone or by letter within the next 1-3 weeks.  Please call us at [phone] if you have not heard about the biopsies in 3 weeks.    SIGNATURES/CONFIDENTIALITY: You and/or your care partner have signed paperwork which will be entered into your electronic medical record.  These signatures attest to the fact that that the information above on your After Visit Summary has been reviewed and is understood.  Full responsibility of the confidentiality of this discharge information lies with you and/or your care-partner.

## 2022-08-18 NOTE — Progress Notes (Signed)
Pt's states no medical or surgical changes since previsit or office visit. 

## 2022-08-18 NOTE — Progress Notes (Signed)
Report to PACU, RN, vss, BBS= Clear.  

## 2022-08-18 NOTE — Op Note (Signed)
[facility] Patient Name: Alison Simmons Procedure Date: 08/18/2022 2:36 PM MRN: [id number] Endoscopist: Viviann Spare P. Adela Lank , MD, [id number] Age: 67 Referring MD:  Date of Birth: [birth year] Gender: Female Account #: [id number] Procedure:                Colonoscopy Indications:              High risk colon cancer surveillance: Personal                            history of colonic polyps - adenoma removed 07/2015 Medicines:                Monitored Anesthesia Care Procedure:                Pre-Anesthesia Assessment:                           - Prior to the procedure, a History and Physical                            was performed, and patient medications and                            allergies were reviewed. The patient's tolerance of                            previous anesthesia was also reviewed. The risks                            and benefits of the procedure and the sedation                            options and risks were discussed with the patient.                            All questions were answered, and informed consent                            was obtained. Prior Anticoagulants: The patient has                            taken no anticoagulant or antiplatelet agents. ASA                            Grade Assessment: II - A patient with mild systemic                            disease. After reviewing the risks and benefits,                            the patient was deemed in satisfactory condition to                            undergo the procedure.  After obtaining informed consent, the colonoscope                            was passed under direct vision. Throughout the                            procedure, the patient's blood pressure, pulse, and                            oxygen saturations were monitored continuously. The                            PCF-HQ190L Colonoscope [id number] was introduced                            through  the anus and advanced to the the cecum,                            identified by appendiceal orifice and ileocecal                            valve. The colonoscopy was performed without                            difficulty. The patient tolerated the procedure                            well. The quality of the bowel preparation was                            good. The ileocecal valve, appendiceal orifice, and                            rectum were photographed. Scope In: 2:41:08 PM Scope Out: 2:53:18 PM Scope Withdrawal Time: 0 hours 8 minutes 51 seconds  Total Procedure Duration: 0 hours 12 minutes 10 seconds  Findings:                 The perianal and digital rectal examinations were                            normal.                           Multiple medium-mouthed diverticula were found in                            the transverse colon, left colon and right colon.                           The exam was otherwise without abnormality. Of                            note, due to small size of the rectum, retroflexed  views not obtained. Complications:            No immediate complications. Estimated blood loss:                            None. Estimated Blood Loss:     Estimated blood loss: none. Impression:               - Diverticulosis in the transverse colon, in the                            left colon and in the right colon.                           - The examination was otherwise normal.                           - No polyps.                           - The GI Genius (intelligent endoscopy module),                            computer-aided polyp detection system powered by AI                            was utilized to detect colorectal polyps through                            enhanced visualization during colonoscopy. Recommendation:           - Patient has a contact number available for                            emergencies. The signs and  symptoms of potential                            delayed complications were discussed with the                            patient. Return to normal activities tomorrow.                            Written discharge instructions were provided to the                            patient.                           - Resume previous diet.                           - Continue present medications.                           - Repeat colonoscopy in 10 years for surveillance. Viviann Spare P. Tehilla Coffel, MD 08/18/2022 2:56:53 PM This report has been signed electronically.

## 2022-08-18 NOTE — Progress Notes (Signed)
[facility] History and Physical   Primary Care Physician:  Bennie Pierini, FNP   Reason for Procedure:   History of colon polyps  Plan:    colonoscopy     HPI: Alison Simmons is a 67 y.o. female  here for colonoscopy surveillance - last exam 07/2015 - one rectal adenoma removed.    Chronic constipation. No family history of colon cancer known. Otherwise feels well without any cardiopulmonary symptoms.   I have discussed risks / benefits of anesthesia and endoscopic procedure with Thomes Dinning and they wish to proceed with the exams as outlined today.    Past Medical History:  Diagnosis Date   Cystocele    Hypertension    Thyroid disease     Past Surgical History:  Procedure Laterality Date   APPENDECTOMY     COLONOSCOPY     VAGINAL DELIVERY     x2    Prior to Admission medications   Medication Sig Start Date End Date Taking? Authorizing Provider  ALPRAZolam Prudy Feeler) 0.25 MG tablet 1 po prn flying Patient not taking: Reported on 11/24/2018 12/08/17   Bennie Pierini, FNP  hydrocortisone 2.5 % cream Apply topically 2 (two) times daily. Patient not taking: Reported on 06/26/2022 11/24/18   Bennie Pierini, FNP  ketoconazole (NIZORAL) 2 % shampoo Apply 1 application topically 2 (two) times a week. Patient not taking: Reported on 06/26/2022 11/25/18   Bennie Pierini, FNP  levothyroxine (SYNTHROID) 75 MCG tablet Take 1 tablet (75 mcg total) by mouth daily. Patient not taking: Reported on 06/26/2022 11/25/18   Bennie Pierini, FNP  levothyroxine (SYNTHROID) 88 MCG tablet Take 88 mcg by mouth daily.    [provider]  lisinopril-hydrochlorothiazide (ZESTORETIC) 10-12.5 MG tablet TAKE 1 TABLET BY MOUTH DAILY (NEEDS APT BEFORE NEXT REFILL) 11/24/18   Daphine Deutscher, Mary-Margaret, FNP  MAGNESIUM GLYCINATE PO Take 1 tablet by mouth daily. 240 mg    [provider]  METAMUCIL FIBER PO Take 3 tablets by mouth daily.    [provider]    Current Outpatient Medications  Medication Sig Dispense Refill   levothyroxine (SYNTHROID) 88 MCG tablet Take 88 mcg by mouth daily.     lisinopril-hydrochlorothiazide (ZESTORETIC) 10-12.5 MG tablet TAKE 1 TABLET BY MOUTH DAILY (NEEDS APT BEFORE NEXT REFILL) 90 tablet 3   ALPRAZolam (XANAX) 0.25 MG tablet 1 po prn flying (Patient not taking: Reported on 11/24/2018) 10 tablet 0   METAMUCIL FIBER PO Take 3 tablets by mouth daily.     Current Facility-Administered Medications  Medication Dose Route Frequency Provider Last Rate Last Admin   0.9 %  sodium chloride infusion  500 mL Intravenous Once Amberlyn Martinezgarcia, Willaim Rayas, MD        Allergies as of 08/18/2022   (No Known Allergies)    Family History  Problem Relation Age of Onset   Colon polyps Mother    COPD Mother    Diabetes Mother    Hypertension Mother    Hyperlipidemia Mother    Diabetes Father    Colon cancer Neg Hx    Esophageal cancer Neg Hx    Rectal cancer Neg Hx    Stomach cancer Neg Hx     Social History   Socioeconomic History   Marital status: Married    Spouse name: Not on file   Number of children: Not on file   Years of education: Not on file   Highest education level: Not on file  Occupational History   Not on file  Tobacco Use   Smoking status: Never   Smokeless tobacco: Never  Vaping Use   Vaping Use: Never used  Substance and Sexual Activity   Alcohol use: No    Alcohol/week: 0.0 standard drinks of alcohol   Drug use: No   Sexual activity: Not on file  Other Topics Concern   Not on file  Social History Narrative   Not on file   Social Determinants of Health   Financial Resource Strain: Not on file  Food Insecurity: Not on file  Transportation Needs: Not on file  Physical Activity: Not on file  Stress: Not on file  Social Connections: Not on file  Intimate Partner Violence: Not on file    Review of Systems: All other review of systems negative except as mentioned in  the HPI.  Physical Exam: Vital signs BP (!) 144/92   Pulse 97   Temp 97.7 F (36.5 C)   Resp 17   Ht 5\' 1"  (1.549 m)   Wt 137 lb 12.8 oz (62.5 kg)   SpO2 100%   BMI 26.04 kg/m   General:   Alert,  Well-developed, pleasant and cooperative in NAD Lungs:  Clear throughout to auscultation.   Heart:  Regular rate and rhythm Abdomen:  Soft, nontender and nondistended.   Neuro/Psych:  Alert and cooperative. Normal mood and affect. A and O x 3  Harlin Rain, MD [facility]

## 2022-08-19 ENCOUNTER — Telehealth: Payer: Self-pay | Admitting: *Deleted

## 2022-08-19 NOTE — Telephone Encounter (Signed)
  Follow up Call-     08/18/2022    2:22 PM  Call back number  Post procedure Call Back phone  # [phone]  Permission to leave phone message Yes     Patient questions:  Do you have a fever, pain , or abdominal swelling? No. Pain Score  0 *  Have you tolerated food without any problems? Yes.    Have you been able to return to your normal activities? Yes.    Do you have any questions about your discharge instructions: Diet   No. Medications  No. Follow up visit  No.  Do you have questions or concerns about your Care? No.  Actions: * If pain score is 4 or above: No action needed, pain <4.
